# Patient Record
Sex: Female | Born: 1970 | Race: White | Hispanic: No | Marital: Married | Smoking: Former smoker
Health system: Southern US, Community
[De-identification: ages and names within clinical notes are randomized; demographics above are authoritative.]

## PROBLEM LIST (undated history)

## (undated) DIAGNOSIS — I10 Essential (primary) hypertension: Secondary | ICD-10-CM

## (undated) DIAGNOSIS — E782 Mixed hyperlipidemia: Secondary | ICD-10-CM

## (undated) DIAGNOSIS — F411 Generalized anxiety disorder: Secondary | ICD-10-CM

## (undated) DIAGNOSIS — J45909 Unspecified asthma, uncomplicated: Secondary | ICD-10-CM

## (undated) DIAGNOSIS — K219 Gastro-esophageal reflux disease without esophagitis: Secondary | ICD-10-CM

## (undated) HISTORY — DX: Mixed hyperlipidemia: E78.2

## (undated) HISTORY — DX: Gastro-esophageal reflux disease without esophagitis: K21.9

## (undated) HISTORY — DX: Generalized anxiety disorder: F41.1

---

## 2006-09-04 ENCOUNTER — Ambulatory Visit: Payer: Self-pay | Admitting: Unknown Physician Specialty

## 2006-09-07 ENCOUNTER — Ambulatory Visit: Payer: Self-pay | Admitting: Unknown Physician Specialty

## 2008-06-06 HISTORY — PX: ABDOMINAL HYSTERECTOMY: SHX81

## 2009-08-17 ENCOUNTER — Ambulatory Visit: Payer: Self-pay | Admitting: Internal Medicine

## 2009-08-18 ENCOUNTER — Ambulatory Visit: Payer: Self-pay | Admitting: Internal Medicine

## 2011-06-07 HISTORY — PX: BREAST EXCISIONAL BIOPSY: SUR124

## 2011-06-07 HISTORY — PX: BREAST BIOPSY: SHX20

## 2011-10-26 ENCOUNTER — Ambulatory Visit: Payer: Self-pay | Admitting: Internal Medicine

## 2011-10-28 ENCOUNTER — Ambulatory Visit: Payer: Self-pay | Admitting: Internal Medicine

## 2011-11-14 ENCOUNTER — Ambulatory Visit: Payer: Self-pay | Admitting: Emergency Medicine

## 2011-11-14 LAB — POTASSIUM: Potassium: 3.9 mmol/L (ref 3.5–5.1)

## 2011-11-15 ENCOUNTER — Ambulatory Visit: Payer: Self-pay | Admitting: Emergency Medicine

## 2011-11-16 LAB — PATHOLOGY REPORT

## 2013-04-09 ENCOUNTER — Ambulatory Visit: Payer: Self-pay | Admitting: Internal Medicine

## 2014-04-23 ENCOUNTER — Ambulatory Visit: Payer: Self-pay | Admitting: Internal Medicine

## 2014-09-28 NOTE — Op Note (Signed)
PATIENT NAME:  Kayla Carlson, Callee MR#:  [id number] DATE OF BIRTH:  [birth year]  DATE OF PROCEDURE:  11/15/2011  HISTORY: This is a patient who had a mammogram done which showed a possible lesion in her left breast, but the problem was only showing on one view. I did discuss with the radiologist who read the films and he told me that we may be able to localize that with ultrasound guidance and we were dealing with mammogram findings that were only showing it on one view.  I could not feel any lump in her breast. Needle localization was done by the radiologist. He told me that he had really difficult needle localization and he said the wire was going through the lesion.   DESCRIPTION OF PROCEDURE: So the patient was then brought to surgery. Under general anesthesia, the left breast was then prepped and draped. An incision was made over the top of the wire. After cutting skin and subcutaneous tissue, we did wide resection going all around the wire. I never saw the wire. I took out the whole wire with the breast tissue around it. After the bleeding was stopped with the Bovie, by inserting my finger, I examined the breast completely. I could not feel any lump inside anymore, which I really could not even feel preop. After this was done, the subcutaneous tissue was then closed with 5-0 and 4-0 Vicryl sutures and glue was then applied.   The patient also had developed an abscess in her right inner thigh and she was very sore to walk on it. She is a kind of obese woman. Her thighs rub each other so wonder if it is due to infection because of that. This is the first time I saw it. During surgery, after that, she had a very large, red swollen area over the medial aspect of the thigh. It was then incised and a lot of pus came out. It looked like some fat necrosis was present in the middle also. We irrigated this area and then the wound was left open with Iodoform gauze dressing on it. The patient tolerated the procedure  well. ____________________________ Alton RevereMasud S. Cecelia ByarsHashmi, MD msh:slb D: 11/15/2011 10:09:03 ET    T: 11/15/2011 12:00:28 ET       JOB#: [id number] cc: Sahra Converse S. Cecelia ByarsHashmi, MD, <Dictator> Margaretann LovelessNeelam S. Khan, MD Meryle ReadyMASUD S Elva Mauro MD ELECTRONICALLY SIGNED 11/16/2011 13:07

## 2015-06-02 ENCOUNTER — Other Ambulatory Visit: Payer: Self-pay | Admitting: Nurse Practitioner

## 2015-06-02 DIAGNOSIS — Z1231 Encounter for screening mammogram for malignant neoplasm of breast: Secondary | ICD-10-CM

## 2015-06-04 ENCOUNTER — Ambulatory Visit
Admission: RE | Admit: 2015-06-04 | Discharge: 2015-06-04 | Disposition: A | Discharge status: Home / Self Care | Payer: 59 | Source: Ambulatory Visit | Attending: Nurse Practitioner | Admitting: Nurse Practitioner

## 2015-06-04 DIAGNOSIS — Z1231 Encounter for screening mammogram for malignant neoplasm of breast: Secondary | ICD-10-CM | POA: Insufficient documentation

## 2015-06-09 ENCOUNTER — Ambulatory Visit: Payer: Self-pay

## 2016-05-09 ENCOUNTER — Other Ambulatory Visit: Payer: Self-pay | Admitting: Internal Medicine

## 2016-05-09 DIAGNOSIS — Z1231 Encounter for screening mammogram for malignant neoplasm of breast: Secondary | ICD-10-CM

## 2016-07-15 ENCOUNTER — Ambulatory Visit
Admission: RE | Admit: 2016-07-15 | Discharge: 2016-07-15 | Disposition: A | Discharge status: Home / Self Care | Payer: BLUE CROSS/BLUE SHIELD | Source: Ambulatory Visit | Attending: Internal Medicine | Admitting: Internal Medicine

## 2016-07-15 DIAGNOSIS — Z1231 Encounter for screening mammogram for malignant neoplasm of breast: Secondary | ICD-10-CM | POA: Insufficient documentation

## 2016-07-15 DIAGNOSIS — R928 Other abnormal and inconclusive findings on diagnostic imaging of breast: Secondary | ICD-10-CM | POA: Diagnosis not present

## 2016-07-19 ENCOUNTER — Other Ambulatory Visit: Payer: Self-pay | Admitting: Internal Medicine

## 2016-07-19 DIAGNOSIS — N6310 Unspecified lump in the right breast, unspecified quadrant: Secondary | ICD-10-CM

## 2016-07-19 DIAGNOSIS — R928 Other abnormal and inconclusive findings on diagnostic imaging of breast: Secondary | ICD-10-CM

## 2016-08-01 ENCOUNTER — Ambulatory Visit
Admission: RE | Admit: 2016-08-01 | Discharge: 2016-08-01 | Disposition: A | Discharge status: Home / Self Care | Payer: BLUE CROSS/BLUE SHIELD | Source: Ambulatory Visit | Attending: Internal Medicine | Admitting: Internal Medicine

## 2016-08-01 DIAGNOSIS — N6310 Unspecified lump in the right breast, unspecified quadrant: Secondary | ICD-10-CM

## 2016-08-01 DIAGNOSIS — R928 Other abnormal and inconclusive findings on diagnostic imaging of breast: Secondary | ICD-10-CM | POA: Diagnosis not present

## 2017-09-04 ENCOUNTER — Other Ambulatory Visit: Payer: Self-pay | Admitting: Internal Medicine

## 2017-09-04 DIAGNOSIS — Z1231 Encounter for screening mammogram for malignant neoplasm of breast: Secondary | ICD-10-CM

## 2017-09-09 ENCOUNTER — Other Ambulatory Visit: Payer: Self-pay

## 2017-09-09 ENCOUNTER — Encounter: Payer: Self-pay | Admitting: Emergency Medicine

## 2017-09-09 ENCOUNTER — Inpatient Hospital Stay
Admission: EM | Admit: 2017-09-09 | Discharge: 2017-09-11 | DRG: 191 | Disposition: A | Discharge status: Home / Self Care | Payer: BLUE CROSS/BLUE SHIELD | Attending: Internal Medicine | Admitting: Internal Medicine

## 2017-09-09 ENCOUNTER — Emergency Department: Payer: BLUE CROSS/BLUE SHIELD

## 2017-09-09 DIAGNOSIS — I10 Essential (primary) hypertension: Secondary | ICD-10-CM | POA: Diagnosis present

## 2017-09-09 DIAGNOSIS — Z6841 Body Mass Index (BMI) 40.0 and over, adult: Secondary | ICD-10-CM

## 2017-09-09 DIAGNOSIS — J441 Chronic obstructive pulmonary disease with (acute) exacerbation: Principal | ICD-10-CM | POA: Diagnosis present

## 2017-09-09 DIAGNOSIS — F17200 Nicotine dependence, unspecified, uncomplicated: Secondary | ICD-10-CM | POA: Diagnosis present

## 2017-09-09 DIAGNOSIS — R0602 Shortness of breath: Secondary | ICD-10-CM | POA: Diagnosis not present

## 2017-09-09 DIAGNOSIS — J440 Chronic obstructive pulmonary disease with acute lower respiratory infection: Secondary | ICD-10-CM | POA: Diagnosis present

## 2017-09-09 DIAGNOSIS — Z803 Family history of malignant neoplasm of breast: Secondary | ICD-10-CM

## 2017-09-09 DIAGNOSIS — Z9981 Dependence on supplemental oxygen: Secondary | ICD-10-CM

## 2017-09-09 DIAGNOSIS — J40 Bronchitis, not specified as acute or chronic: Secondary | ICD-10-CM

## 2017-09-09 DIAGNOSIS — R0902 Hypoxemia: Secondary | ICD-10-CM | POA: Diagnosis present

## 2017-09-09 DIAGNOSIS — E785 Hyperlipidemia, unspecified: Secondary | ICD-10-CM | POA: Diagnosis present

## 2017-09-09 DIAGNOSIS — J209 Acute bronchitis, unspecified: Secondary | ICD-10-CM | POA: Diagnosis present

## 2017-09-09 DIAGNOSIS — Z720 Tobacco use: Secondary | ICD-10-CM

## 2017-09-09 DIAGNOSIS — Z90710 Acquired absence of both cervix and uterus: Secondary | ICD-10-CM

## 2017-09-09 DIAGNOSIS — Z91048 Other nonmedicinal substance allergy status: Secondary | ICD-10-CM

## 2017-09-09 DIAGNOSIS — Z885 Allergy status to narcotic agent status: Secondary | ICD-10-CM

## 2017-09-09 DIAGNOSIS — F419 Anxiety disorder, unspecified: Secondary | ICD-10-CM | POA: Diagnosis present

## 2017-09-09 DIAGNOSIS — R0603 Acute respiratory distress: Secondary | ICD-10-CM | POA: Diagnosis present

## 2017-09-09 DIAGNOSIS — J45901 Unspecified asthma with (acute) exacerbation: Secondary | ICD-10-CM

## 2017-09-09 HISTORY — DX: Essential (primary) hypertension: I10

## 2017-09-09 HISTORY — DX: Unspecified asthma, uncomplicated: J45.909

## 2017-09-09 LAB — CBC
HCT: 48.3 % — ABNORMAL HIGH (ref 35.0–47.0)
Hemoglobin: 16.3 g/dL — ABNORMAL HIGH (ref 12.0–16.0)
MCH: 31 pg (ref 26.0–34.0)
MCHC: 33.7 g/dL (ref 32.0–36.0)
MCV: 92.2 fL (ref 80.0–100.0)
Platelets: 235 K/uL (ref 150–440)
RBC: 5.25 MIL/uL — ABNORMAL HIGH (ref 3.80–5.20)
RDW: 13.8 % (ref 11.5–14.5)
WBC: 16.1 K/uL — ABNORMAL HIGH (ref 3.6–11.0)

## 2017-09-09 LAB — BASIC METABOLIC PANEL
Anion gap: 12 (ref 5–15)
BUN: 6 mg/dL (ref 6–20)
CALCIUM: 10.7 mg/dL — AB (ref 8.9–10.3)
CO2: 23 mmol/L (ref 22–32)
Chloride: 103 mmol/L (ref 101–111)
Creatinine, Ser: 0.7 mg/dL (ref 0.44–1.00)
GFR calc Af Amer: >60 mL/min (ref >60)
GLUCOSE: 157 mg/dL — AB (ref 65–99)
POTASSIUM: 3.4 mmol/L — AB (ref 3.5–5.1)
Sodium: 138 mmol/L (ref 135–145)

## 2017-09-09 LAB — TROPONIN I: Troponin I: <0.03 ng/mL

## 2017-09-09 MED ORDER — ALBUTEROL SULFATE HFA 108 (90 BASE) MCG/ACT IN AERS: 2.0000 | INHALATION_SPRAY | Freq: Four times a day (QID) | RESPIRATORY_TRACT | 2 refills | Status: DC | PRN

## 2017-09-09 MED ORDER — METHYLPREDNISOLONE SODIUM SUCC 125 MG IJ SOLR
125.0000 mg | Freq: Once | INTRAMUSCULAR | Status: AC
  Administered 2017-09-09: 125 mg via INTRAVENOUS
  Filled 2017-09-09: qty 2

## 2017-09-09 MED ORDER — ALBUTEROL SULFATE (2.5 MG/3ML) 0.083% IN NEBU
5.0000 mg | INHALATION_SOLUTION | Freq: Once | RESPIRATORY_TRACT | Status: AC
  Administered 2017-09-09: 5 mg via RESPIRATORY_TRACT
  Filled 2017-09-09: qty 6

## 2017-09-09 MED ORDER — PREDNISONE 20 MG PO TABS: 60.0000 mg | ORAL_TABLET | Freq: Every day | ORAL | 0 refills | Status: DC

## 2017-09-09 MED ORDER — IPRATROPIUM-ALBUTEROL 0.5-2.5 (3) MG/3ML IN SOLN
3.0000 mL | Freq: Once | RESPIRATORY_TRACT | Status: AC
  Administered 2017-09-09: 3 mL via RESPIRATORY_TRACT
  Filled 2017-09-09: qty 3

## 2017-09-09 MED ORDER — IPRATROPIUM-ALBUTEROL 0.5-2.5 (3) MG/3ML IN SOLN
3.0000 mL | Freq: Once | RESPIRATORY_TRACT | Status: AC
  Administered 2017-09-09: 3 mL via RESPIRATORY_TRACT

## 2017-09-09 MED ORDER — IPRATROPIUM-ALBUTEROL 0.5-2.5 (3) MG/3ML IN SOLN
RESPIRATORY_TRACT | Status: AC
  Administered 2017-09-09: 3 mL via RESPIRATORY_TRACT
  Filled 2017-09-09: qty 3

## 2017-09-09 MED ORDER — AZITHROMYCIN 500 MG PO TABS
500.0000 mg | ORAL_TABLET | Freq: Every day | ORAL | Status: DC
  Administered 2017-09-09 – 2017-09-11 (×3): 500 mg via ORAL
  Filled 2017-09-09 (×3): qty 1

## 2017-09-09 MED ORDER — SODIUM CHLORIDE 0.9 % IV BOLUS
500.0000 mL | Freq: Once | INTRAVENOUS | Status: AC
  Administered 2017-09-09: 500 mL via INTRAVENOUS

## 2017-09-09 MED ORDER — AZITHROMYCIN 250 MG PO TABS: ORAL_TABLET | ORAL | 0 refills | Status: DC

## 2017-09-09 NOTE — ED Provider Notes (Signed)
[facility] Emergency Department Provider Note  ____________________________________________   I have reviewed the triage vital signs and the nursing notes. Where available I have reviewed prior notes and, if possible and indicated, outside hospital notes.    HISTORY  Chief Complaint Shortness of Breath    HPI Kayla SellsSusan Carlson is a 47 y.o. female with a history of significant tobacco abuse, pack a day smoker for 18 years at least, does have a history of asthma in the past, and has an inhaler, she has URI symptoms with mild sore throat slight rhinorrhea and cough for the last couple days and became very wheezy today.  No fever.  Chest pain no leg swelling.  She is on home oxygen.  She received a triage albuterol nebulizer and feels much better although she is still wheezing.  She is adamant that she does not wish to be admitted to the hospital.  Symptoms began in earnest today although she is been having mild cold symptoms for a few days, her albuterol made it better nothing else seems to make it worse no other associated symptoms     Past Medical History:  Diagnosis Date  . Asthma   . Hypertension     There are no active problems to display for this patient.   Past Surgical History:  Procedure Laterality Date  . ABDOMINAL HYSTERECTOMY  2010  . BREAST BIOPSY Left 2013    Prior to Admission medications   Medication Sig Start Date End Date Taking? Authorizing Provider  amLODipine (NORVASC) 5 MG tablet Take 1 tablet by mouth daily. 06/03/17  Yes [provider]  benazepril (LOTENSIN) 10 MG tablet Take 1 tablet by mouth daily. 06/03/17  Yes [provider]  spironolactone (ALDACTONE) 50 MG tablet Take 1 tablet by mouth daily. 08/01/17  Yes [provider]  Vitamin D, Ergocalciferol, (DRISDOL) 50000 units CAPS capsule Take 1 capsule by mouth every 7 (seven) days. 09/03/17  Yes [provider]  albuterol (PROVENTIL  HFA;VENTOLIN HFA) 108 (90 Base) MCG/ACT inhaler Inhale 2 puffs into the lungs every 6 (six) hours as needed. 09/02/17   [provider]    Allergies Codeine and Lisinopril  Family History  Problem Relation Age of Onset  . Breast cancer Mother 2945    Social History Social History   Tobacco Use  . Smoking status: Current Every Day Smoker  . Smokeless tobacco: Never Used  Substance Use Topics  . Alcohol use: Never    Frequency: Never  . Drug use: Not on file    Review of Systems Constitutional: No fever/chills Eyes: No visual changes. ENT: + sore throat. No stiff neck no neck pain Cardiovascular: Denies chest pain. Respiratory: + shortness of breath. Gastrointestinal:   no vomiting.  No diarrhea.  No constipation. Genitourinary: Negative for dysuria. Musculoskeletal: Negative lower extremity swelling Skin: Negative for rash. Neurological: Negative for severe headaches, focal weakness or numbness.   ____________________________________________   PHYSICAL EXAM:  VITAL SIGNS: ED Triage Vitals  Enc Vitals Group     BP 09/09/17 1828 (!) 150/101     Pulse Rate 09/09/17 1828 (!) 134     Resp 09/09/17 1828 (!) 30     Temp 09/09/17 1828 98.9 F (37.2 C)     Temp Source 09/09/17 1828 Oral     SpO2 09/09/17 1828 (!) 88 %     Weight 09/09/17 1829 235 lb (106.6 kg)     Height 09/09/17 1829 5\' 2"  (1.575 m)  Head Circumference --      Peak Flow --      Pain Score 09/09/17 1829 0     Pain Loc --      Pain Edu? --      Excl. in GC? --     Constitutional: Alert and oriented. Well appearing and in no acute distress. Eyes: Conjunctivae are normal Head: Atraumatic HEENT: No congestion/rhinnorhea. Mucous membranes are moist.  Oropharynx non-erythematous Neck:   Nontender with no meningismus, no masses, no stridor Cardiovascular: Tachycardia noted regular rhythm. Grossly normal heart sounds.  Good peripheral circulation. Respiratory: Normal respiratory effort.   Patient with very slight diffuse wheeze noted Abdominal: Soft and nontender. No distention. No guarding no rebound Back:  There is no focal tenderness or step off.  there is no midline tenderness there are no lesions noted. there is no CVA tenderness Musculoskeletal: No lower extremity tenderness, no upper extremity tenderness. No joint effusions, no DVT signs strong distal pulses no edema Neurologic:  Normal speech and language. No gross focal neurologic deficits are appreciated.  Skin:  Skin is warm, dry and intact. No rash noted. Psychiatric: Mood and affect are normal. Speech and behavior are normal.  ____________________________________________   LABS (all labs ordered are listed, but only abnormal results are displayed)  Labs Reviewed  BASIC METABOLIC PANEL - Abnormal; Notable for the following components:      Result Value   Potassium 3.4 (*)    Glucose, Bld 157 (*)    Calcium 10.7 (*)    All other components within normal limits  CBC - Abnormal; Notable for the following components:   WBC 16.1 (*)    RBC 5.25 (*)    Hemoglobin 16.3 (*)    HCT 48.3 (*)    All other components within normal limits  TROPONIN I    Pertinent labs  results that were available during my care of the patient were reviewed by me and considered in my medical decision making (see chart for details). ____________________________________________  EKG  I personally interpreted any EKGs ordered by me or triage Sinus tach rate 131 no acute ST elevation or depression, LAD noted no acute ischemia ____________________________________________  RADIOLOGY  Pertinent labs & imaging results that were available during my care of the patient were reviewed by me and considered in my medical decision making (see chart for details). If possible, patient and/or family made aware of any abnormal findings.  Dg Chest 2 View  Result Date: 09/09/2017 CLINICAL DATA:  Cough and shortness of Breath EXAM: CHEST - 2 VIEW  COMPARISON:  None. FINDINGS: Cardiac shadow is within normal limits. Some diffuse interstitial changes are noted without focal confluent infiltrate suggestive of bronchitis. Mild left basilar atelectasis is noted as well. No effusion is seen. No bony abnormality is noted. IMPRESSION: Changes of bronchitis with left basilar atelectasis. Electronically Signed   By: Alcide Clever M.D.   On: 09/09/2017 19:48   ____________________________________________    PROCEDURES  Procedure(s) performed: None  Procedures  Critical Care performed: None  ____________________________________________   INITIAL IMPRESSION / ASSESSMENT AND PLAN / ED COURSE  Pertinent labs & imaging results that were available during my care of the patient were reviewed by me and considered in my medical decision making (see chart for details).  Patient here with cough and wheeze.  Chest x-ray shows bronchitic changes we will treat with antibiotics.  I have extensively counseled smoking cessation for more than 6 minutes.  No evidence of ACS PE or  dissection.  However, patient is again somewhat wheezy, her sats did improve markedly after just one nebulizer I will give her another.  We have discussed admission of the hospital but this time she very much would prefer to go home and she declines admission.  She understands risk benefits and alternatives of this course of action.  We will send her home therefore if she clears on steroids, continued albuterol, and antibiotics. Signed out to dr. Derrill Kay at the end of my shift.    ____________________________________________   FINAL CLINICAL IMPRESSION(S) / ED DIAGNOSES  Final diagnoses:  None      This chart was dictated using voice recognition software.  Despite best efforts to proofread,  errors can occur which can change meaning.      Jeanmarie Plant, MD 09/09/17 2109

## 2017-09-09 NOTE — ED Triage Notes (Signed)
Pt ambulatory to triage w/ labored breathing, speaking in short sentences, reports cold sx since yesterday, today became SOB, using inhaler w/o relief.  Pt hx of seasonal asthma.  Lung sounds diminished w/ exp wheeze.  Pt o2sat 88% on RA.

## 2017-09-09 NOTE — ED Notes (Signed)
Attempted to get pt up to ambulate down hall to observe O2 sat. Pt O2 immdediately declined to 86% RA before reaching the hall way. Pt placed back on 2L via nasal canula.

## 2017-09-09 NOTE — Discharge Instructions (Signed)
Return to the emergency room for any new or worrisome symptoms including shortness of breath, stop smoking.  Take the antibiotics until they are gone take the steroids until they are gone.  Stop smoking.

## 2017-09-10 ENCOUNTER — Other Ambulatory Visit: Payer: Self-pay

## 2017-09-10 DIAGNOSIS — F17200 Nicotine dependence, unspecified, uncomplicated: Secondary | ICD-10-CM | POA: Diagnosis present

## 2017-09-10 DIAGNOSIS — Z803 Family history of malignant neoplasm of breast: Secondary | ICD-10-CM | POA: Diagnosis not present

## 2017-09-10 DIAGNOSIS — R0602 Shortness of breath: Secondary | ICD-10-CM | POA: Diagnosis present

## 2017-09-10 DIAGNOSIS — J209 Acute bronchitis, unspecified: Secondary | ICD-10-CM | POA: Diagnosis present

## 2017-09-10 DIAGNOSIS — Z6841 Body Mass Index (BMI) 40.0 and over, adult: Secondary | ICD-10-CM | POA: Diagnosis not present

## 2017-09-10 DIAGNOSIS — Z9981 Dependence on supplemental oxygen: Secondary | ICD-10-CM | POA: Diagnosis not present

## 2017-09-10 DIAGNOSIS — Z885 Allergy status to narcotic agent status: Secondary | ICD-10-CM | POA: Diagnosis not present

## 2017-09-10 DIAGNOSIS — R0603 Acute respiratory distress: Secondary | ICD-10-CM | POA: Diagnosis present

## 2017-09-10 DIAGNOSIS — Z90710 Acquired absence of both cervix and uterus: Secondary | ICD-10-CM | POA: Diagnosis not present

## 2017-09-10 DIAGNOSIS — R0902 Hypoxemia: Secondary | ICD-10-CM | POA: Diagnosis present

## 2017-09-10 DIAGNOSIS — E785 Hyperlipidemia, unspecified: Secondary | ICD-10-CM | POA: Diagnosis present

## 2017-09-10 DIAGNOSIS — J440 Chronic obstructive pulmonary disease with acute lower respiratory infection: Secondary | ICD-10-CM | POA: Diagnosis present

## 2017-09-10 DIAGNOSIS — I10 Essential (primary) hypertension: Secondary | ICD-10-CM | POA: Diagnosis present

## 2017-09-10 DIAGNOSIS — F419 Anxiety disorder, unspecified: Secondary | ICD-10-CM | POA: Diagnosis present

## 2017-09-10 DIAGNOSIS — J441 Chronic obstructive pulmonary disease with (acute) exacerbation: Secondary | ICD-10-CM | POA: Diagnosis present

## 2017-09-10 DIAGNOSIS — Z91048 Other nonmedicinal substance allergy status: Secondary | ICD-10-CM | POA: Diagnosis not present

## 2017-09-10 LAB — BASIC METABOLIC PANEL
ANION GAP: 8 (ref 5–15)
BUN: 9 mg/dL (ref 6–20)
CALCIUM: 10.5 mg/dL — AB (ref 8.9–10.3)
CO2: 24 mmol/L (ref 22–32)
CREATININE: 0.74 mg/dL (ref 0.44–1.00)
Chloride: 106 mmol/L (ref 101–111)
GFR calc Af Amer: >60 mL/min (ref >60)
Glucose, Bld: 167 mg/dL — ABNORMAL HIGH (ref 65–99)
Potassium: 3.8 mmol/L (ref 3.5–5.1)
Sodium: 138 mmol/L (ref 135–145)

## 2017-09-10 LAB — CBC
HCT: 44 % (ref 35.0–47.0)
HEMOGLOBIN: 15.5 g/dL (ref 12.0–16.0)
MCH: 32 pg (ref 26.0–34.0)
MCHC: 35.1 g/dL (ref 32.0–36.0)
MCV: 91.1 fL (ref 80.0–100.0)
PLATELETS: 229 10*3/uL (ref 150–440)
RBC: 4.83 MIL/uL (ref 3.80–5.20)
RDW: 13.6 % (ref 11.5–14.5)
WBC: 15.2 10*3/uL — ABNORMAL HIGH (ref 3.6–11.0)

## 2017-09-10 MED ORDER — GUAIFENESIN-DM 100-10 MG/5ML PO SYRP: 5.0000 mL | ORAL_SOLUTION | ORAL | Status: DC | PRN

## 2017-09-10 MED ORDER — IPRATROPIUM-ALBUTEROL 0.5-2.5 (3) MG/3ML IN SOLN
3.0000 mL | RESPIRATORY_TRACT | Status: DC
Start: 2017-09-10 — End: 2017-09-11
  Administered 2017-09-10 – 2017-09-11 (×6): 3 mL via RESPIRATORY_TRACT
  Filled 2017-09-10 (×5): qty 3
  Filled 2017-09-10: qty 39

## 2017-09-10 MED ORDER — SPIRONOLACTONE 25 MG PO TABS
50.0000 mg | ORAL_TABLET | Freq: Every day | ORAL | Status: DC
  Administered 2017-09-10 – 2017-09-11 (×2): 50 mg via ORAL
  Filled 2017-09-10 (×2): qty 2

## 2017-09-10 MED ORDER — METHYLPREDNISOLONE SODIUM SUCC 125 MG IJ SOLR
60.0000 mg | Freq: Four times a day (QID) | INTRAMUSCULAR | Status: DC
  Administered 2017-09-10 – 2017-09-11 (×6): 60 mg via INTRAVENOUS
  Filled 2017-09-10 (×6): qty 2

## 2017-09-10 MED ORDER — ONDANSETRON HCL 4 MG/2ML IJ SOLN: 4.0000 mg | Freq: Four times a day (QID) | INTRAMUSCULAR | Status: DC | PRN

## 2017-09-10 MED ORDER — ACETAMINOPHEN 325 MG PO TABS
650.0000 mg | ORAL_TABLET | Freq: Four times a day (QID) | ORAL | Status: DC | PRN
Start: 2017-09-10 — End: 2017-09-11

## 2017-09-10 MED ORDER — NICOTINE 21 MG/24HR TD PT24
21.0000 mg | MEDICATED_PATCH | Freq: Every day | TRANSDERMAL | Status: DC
  Administered 2017-09-10: 21 mg via TRANSDERMAL
  Filled 2017-09-10 (×2): qty 1

## 2017-09-10 MED ORDER — ONDANSETRON HCL 4 MG PO TABS: 4.0000 mg | ORAL_TABLET | Freq: Four times a day (QID) | ORAL | Status: DC | PRN

## 2017-09-10 MED ORDER — ACETAMINOPHEN 650 MG RE SUPP
650.0000 mg | Freq: Four times a day (QID) | RECTAL | Status: DC | PRN
Start: 2017-09-10 — End: 2017-09-11

## 2017-09-10 MED ORDER — BENAZEPRIL HCL 10 MG PO TABS
10.0000 mg | ORAL_TABLET | Freq: Every day | ORAL | Status: DC
  Administered 2017-09-10 – 2017-09-11 (×2): 10 mg via ORAL
  Filled 2017-09-10 (×2): qty 1

## 2017-09-10 MED ORDER — IPRATROPIUM-ALBUTEROL 0.5-2.5 (3) MG/3ML IN SOLN
3.0000 mL | RESPIRATORY_TRACT | Status: DC | PRN
  Administered 2017-09-10 (×2): 3 mL via RESPIRATORY_TRACT
  Filled 2017-09-10 (×2): qty 3

## 2017-09-10 MED ORDER — ENOXAPARIN SODIUM 40 MG/0.4ML ~~LOC~~ SOLN
40.0000 mg | Freq: Two times a day (BID) | SUBCUTANEOUS | Status: DC
  Administered 2017-09-10: 40 mg via SUBCUTANEOUS
  Filled 2017-09-10 (×3): qty 0.4

## 2017-09-10 MED ORDER — AMLODIPINE BESYLATE 5 MG PO TABS
5.0000 mg | ORAL_TABLET | Freq: Every day | ORAL | Status: DC
  Administered 2017-09-10 – 2017-09-11 (×2): 5 mg via ORAL
  Filled 2017-09-10 (×2): qty 1

## 2017-09-10 NOTE — H&P (Signed)
[facility] [place] [facility]   PATIENT NAME: Kayla SellsSusan Keithley    MR#:  [id number]  DATE OF BIRTH:  [birth year]  DATE OF ADMISSION:  09/09/2017  PRIMARY CARE PHYSICIAN: Margaretann LovelessKhan, Neelam S, MD   REQUESTING/REFERRING PHYSICIAN: Derrill KayGoodman, MD  CHIEF COMPLAINT:   Chief Complaint  Patient presents with  . Shortness of Breath    HISTORY OF PRESENT ILLNESS:  Kayla Carlson  is a 47 y.o. female who presents with significant shortness of breath and wheezing.  Patient states she has had a "cold" for the past couple of days.  She has been taking over-the-counter Mucinex and Robitussin.  She states that today she became acutely short of breath and had significant wheezing.  She has a prior diagnosis of asthma, but is also a current everyday smoker and has been for some time.  She states she has an albuterol inhaler at home but almost never has to use it.  Here in the ED workup is consistent with asthma/COPD exacerbation.  Hospitalist were called for admission  PAST MEDICAL HISTORY:   Past Medical History:  Diagnosis Date  . Asthma   . Hypertension      PAST SURGICAL HISTORY:   Past Surgical History:  Procedure Laterality Date  . ABDOMINAL HYSTERECTOMY  2010  . BREAST BIOPSY Left 2013     SOCIAL HISTORY:   Social History   Tobacco Use  . Smoking status: Current Every Day Smoker  . Smokeless tobacco: Never Used  Substance Use Topics  . Alcohol use: Never    Frequency: Never     FAMILY HISTORY:   Family History  Problem Relation Age of Onset  . Breast cancer Mother 2045     DRUG ALLERGIES:   Allergies  Allergen Reactions  . Codeine   . Lisinopril     MEDICATIONS AT HOME:   Prior to Admission medications   Medication Sig Start Date End Date Taking? Authorizing Provider  amLODipine (NORVASC) 5 MG tablet Take 1 tablet by mouth daily. 06/03/17  Yes [provider]  benazepril (LOTENSIN) 10 MG tablet Take 1 tablet by mouth daily.  06/03/17  Yes [provider]  spironolactone (ALDACTONE) 50 MG tablet Take 1 tablet by mouth daily. 08/01/17  Yes [provider]  Vitamin D, Ergocalciferol, (DRISDOL) 50000 units CAPS capsule Take 1 capsule by mouth every 7 (seven) days. 09/03/17  Yes [provider]  albuterol (PROVENTIL HFA;VENTOLIN HFA) 108 (90 Base) MCG/ACT inhaler Inhale 2 puffs into the lungs every 6 (six) hours as needed. 09/02/17   [provider]  albuterol (PROVENTIL HFA;VENTOLIN HFA) 108 (90 Base) MCG/ACT inhaler Inhale 2 puffs into the lungs every 6 (six) hours as needed for wheezing or shortness of breath. 09/09/17   Jeanmarie PlantMcShane, James A, MD  azithromycin (ZITHROMAX Z-PAK) 250 MG tablet Take 2 tablets (500 mg) on  Day 1,  followed by 1 tablet (250 mg) once daily on Days 2 through 5. 09/09/17   Jeanmarie PlantMcShane, James A, MD  predniSONE (DELTASONE) 20 MG tablet Take 3 tablets (60 mg total) by mouth daily. 09/09/17   Jeanmarie PlantMcShane, James A, MD    REVIEW OF SYSTEMS:  Review of Systems  Constitutional: Negative for chills, fever, malaise/fatigue and weight loss.  HENT: Negative for ear pain, hearing loss and tinnitus.   Eyes: Negative for blurred vision, double vision, pain and redness.  Respiratory: Positive for cough, shortness of breath and wheezing. Negative for hemoptysis.   Cardiovascular: Negative for chest pain, palpitations,  orthopnea and leg swelling.  Gastrointestinal: Negative for abdominal pain, constipation, diarrhea, nausea and vomiting.  Genitourinary: Negative for dysuria, frequency and hematuria.  Musculoskeletal: Negative for back pain, joint pain and neck pain.  Skin:       No acne, rash, or lesions  Neurological: Negative for dizziness, tremors, focal weakness and weakness.  Endo/Heme/Allergies: Negative for polydipsia. Does not bruise/bleed easily.  Psychiatric/Behavioral: Negative for depression. The patient is not nervous/anxious and does not have insomnia.      VITAL SIGNS:    Vitals:   09/09/17 2200 09/09/17 2230 09/09/17 2314 09/09/17 2330  BP: (!) 152/76 136/71  (!) 139/57  Pulse: (!) 113 (!) 109  (!) 113  Resp: (!) 26 16  20   Temp:      TempSrc:      SpO2: 93% (!) 89% 93%   Weight:      Height:       Wt Readings from Last 3 Encounters:  09/09/17 106.6 kg (235 lb)    PHYSICAL EXAMINATION:  Physical Exam  Vitals reviewed. Constitutional: She is oriented to person, place, and time. She appears well-developed and well-nourished. No distress.  HENT:  Head: Normocephalic and atraumatic.  Mouth/Throat: Oropharynx is clear and moist.  Eyes: Pupils are equal, round, and reactive to light. Conjunctivae and EOM are normal. No scleral icterus.  Neck: Normal range of motion. Neck supple. No JVD present. No thyromegaly present.  Cardiovascular: Regular rhythm and intact distal pulses. Exam reveals no gallop and no friction rub.  No murmur heard. Tachycardic  Respiratory: Effort normal. No respiratory distress. She has wheezes (Expiratory wheezes). She has no rales.  GI: Soft. Bowel sounds are normal. She exhibits no distension. There is no tenderness.  Musculoskeletal: Normal range of motion. She exhibits no edema.  No arthritis, no gout  Lymphadenopathy:    She has no cervical adenopathy.  Neurological: She is alert and oriented to person, place, and time. No cranial nerve deficit.  No dysarthria, no aphasia  Skin: Skin is warm and dry. No rash noted. No erythema.  Psychiatric: She has a normal mood and affect. Her behavior is normal. Judgment and thought content normal.    LABORATORY PANEL:   CBC Recent Labs  Lab 09/09/17 1836  WBC 16.1*  HGB 16.3*  HCT 48.3*  PLT 235   ------------------------------------------------------------------------------------------------------------------  Chemistries  Recent Labs  Lab 09/09/17 1836  NA 138  K 3.4*  CL 103  CO2 23  GLUCOSE 157*  BUN 6  CREATININE 0.70  CALCIUM 10.7*    ------------------------------------------------------------------------------------------------------------------  Cardiac Enzymes Recent Labs  Lab 09/09/17 1836  TROPONINI <0.03   ------------------------------------------------------------------------------------------------------------------  RADIOLOGY:  Dg Chest 2 View  Result Date: 09/09/2017 CLINICAL DATA:  Cough and shortness of Breath EXAM: CHEST - 2 VIEW COMPARISON:  None. FINDINGS: Cardiac shadow is within normal limits. Some diffuse interstitial changes are noted without focal confluent infiltrate suggestive of bronchitis. Mild left basilar atelectasis is noted as well. No effusion is seen. No bony abnormality is noted. IMPRESSION: Changes of bronchitis with left basilar atelectasis. Electronically Signed   By: Alcide Clever M.D.   On: 09/09/2017 19:48    EKG:   Orders placed or performed during the hospital encounter of 09/09/17  . EKG 12-Lead  . EKG 12-Lead  . ED EKG  . ED EKG    IMPRESSION AND PLAN:  Principal Problem:   COPD exacerbation (HCC) -IV Solu-Medrol, duo nebs, azithromycin, PRN antitussive Active Problems:   HTN (hypertension) -continue home  meds   HLD (hyperlipidemia) -home dose antilipid   Anxiety -home dose anxiolytics  Chart review performed and case discussed with ED provider. Labs, imaging and/or ECG reviewed by provider and discussed with patient/family. Management plans discussed with the patient and/or family.  DVT PROPHYLAXIS: SubQ lovenox  GI PROPHYLAXIS: None  ADMISSION STATUS: Observation  CODE STATUS:   TOTAL TIME TAKING CARE OF THIS PATIENT: 40 minutes.   Anne Hahn, Karel Turpen FIELDING 09/10/2017, 12:52 AM  [facility]  Office  [phone]  CC: Primary care physician; Margaretann Loveless, MD  Note:  This document was prepared using Dragon voice recognition software and may include unintentional dictation errors.

## 2017-09-10 NOTE — Progress Notes (Addendum)
[employer] - [place] at [facility]                                                                                                                                                                                  Patient Demographics   Kayla Carlson, is a 47 y.o. female, DOB - [birth year], WRU:[id number]  Admit date - 09/09/2017   Admitting Physician Oralia Manis, MD  Outpatient Primary MD for the patient is Margaretann Loveless, MD   LOS - 0  Subjective: Patient seen and evaluated today Has difficulty breathing Has cough productive of phlegm Oxygen saturation drops when patient was being weaned off oxygen Currently comfortable on oxygen via nasal cannula 2 L  Review of Systems:   CONSTITUTIONAL: No documented fever. No fatigue, weakness. No weight gain, no weight loss.  EYES: No blurry or double vision.  ENT: No tinnitus. No postnasal drip. No redness of the oropharynx.  RESPIRATORY: Has cough, bilateral wheezing, no hemoptysis.  Has dyspnea.  CARDIOVASCULAR: No chest pain. No orthopnea. No palpitations. No syncope.  GASTROINTESTINAL: No nausea, no vomiting or diarrhea. No abdominal pain. No melena or hematochezia.  GENITOURINARY: No dysuria or hematuria.  ENDOCRINE: No polyuria or nocturia. No heat or cold intolerance.  HEMATOLOGY: No anemia. No bruising. No bleeding.  INTEGUMENTARY: No rashes. No lesions.  MUSCULOSKELETAL: No arthritis. No swelling. No gout.  NEUROLOGIC: No numbness, tingling, or ataxia. No seizure-type activity.  PSYCHIATRIC: No anxiety. No insomnia. No ADD.    Vitals:   Vitals:   09/10/17 0134 09/10/17 0200 09/10/17 0611 09/10/17 0819  BP: (!) 126/58 134/64  134/65  Pulse: (!) 105 100  97  Resp: (!) 24   18  Temp:  97.6 F (36.4 C)  98.5 F (36.9 C)  TempSrc:  Oral  Oral  SpO2: 92% 93% 92% 90%  Weight:      Height:        Wt Readings from Last 3 Encounters:  09/09/17 106.6 kg (235 lb)     Intake/Output Summary (Last 24 hours) at  09/10/2017 1250 Last data filed at 09/10/2017 0300 Gross per 24 hour  Intake 740 ml  Output -  Net 740 ml    Physical Exam:   GENERAL: Pleasant-appearing in no apparent distress.  HEAD, EYES, EARS, NOSE AND THROAT: Atraumatic, normocephalic. Extraocular muscles are intact. Pupils equal and reactive to light. Sclerae anicteric. No conjunctival injection. No oro-pharyngeal erythema.  NECK: Supple. There is no jugular venous distention. No bruits, no lymphadenopathy, no thyromegaly.  HEART: Regular rate and rhythm,. No murmurs, no rubs, no clicks.  LUNGS: Decreased airflow in both lungs Bilateral wheezing, scattered rales in both lungs ABDOMEN:  Soft, flat, nontender, nondistended. Has good bowel sounds. No hepatosplenomegaly appreciated.  EXTREMITIES: No evidence of any cyanosis, clubbing, or peripheral edema.  +2 pedal and radial pulses bilaterally.  NEUROLOGIC: The patient is alert, awake, and oriented x3 with no focal motor or sensory deficits appreciated bilaterally.  SKIN: Moist and warm with no rashes appreciated.  Psych: Not anxious, depressed LN: No inguinal LN enlargement    Antibiotics   Anti-infectives (From admission, onward)   Start     Dose/Rate Route Frequency Ordered Stop   09/09/17 2100  azithromycin (ZITHROMAX) tablet 500 mg     500 mg Oral Daily 09/09/17 2053     09/09/17 0000  azithromycin (ZITHROMAX Z-PAK) 250 MG tablet        09/09/17 2110        Medications   Scheduled Meds: . amLODipine  5 mg Oral Daily  . azithromycin  500 mg Oral Daily  . benazepril  10 mg Oral Daily  . enoxaparin (LOVENOX) injection  40 mg Subcutaneous BID  . ipratropium-albuterol  3 mL Nebulization Q4H  . methylPREDNISolone (SOLU-MEDROL) injection  60 mg Intravenous Q6H  . spironolactone  50 mg Oral Daily   Continuous Infusions: PRN Meds:.acetaminophen **OR** acetaminophen, guaiFENesin-dextromethorphan, ondansetron **OR** ondansetron (ZOFRAN) IV   Data Review:   Micro  Results No results found for this or any previous visit (from the past 240 hour(s)).  Radiology Reports Dg Chest 2 View  Result Date: 09/09/2017 CLINICAL DATA:  Cough and shortness of Breath EXAM: CHEST - 2 VIEW COMPARISON:  None. FINDINGS: Cardiac shadow is within normal limits. Some diffuse interstitial changes are noted without focal confluent infiltrate suggestive of bronchitis. Mild left basilar atelectasis is noted as well. No effusion is seen. No bony abnormality is noted. IMPRESSION: Changes of bronchitis with left basilar atelectasis. Electronically Signed   By: Alcide CleverMark  Lukens M.D.   On: 09/09/2017 19:48     CBC Recent Labs  Lab 09/09/17 1836 09/10/17 0356  WBC 16.1* 15.2*  HGB 16.3* 15.5  HCT 48.3* 44.0  PLT 235 229  MCV 92.2 91.1  MCH 31.0 32.0  MCHC 33.7 35.1  RDW 13.8 13.6    Chemistries  Recent Labs  Lab 09/09/17 1836 09/10/17 0356  NA 138 138  K 3.4* 3.8  CL 103 106  CO2 23 24  GLUCOSE 157* 167*  BUN 6 9  CREATININE 0.70 0.74  CALCIUM 10.7* 10.5*   ------------------------------------------------------------------------------------------------------------------ estimated creatinine clearance is 99.8 mL/min (by C-G formula based on SCr of 0.74 mg/dL). ------------------------------------------------------------------------------------------------------------------ No results for input(s): HGBA1C in the last 72 hours. ------------------------------------------------------------------------------------------------------------------ No results for input(s): CHOL, HDL, LDLCALC, TRIG, CHOLHDL, LDLDIRECT in the last 72 hours. ------------------------------------------------------------------------------------------------------------------ No results for input(s): TSH, T4TOTAL, T3FREE, THYROIDAB in the last 72 hours.  Invalid input(s): FREET3 ------------------------------------------------------------------------------------------------------------------ No  results for input(s): VITAMINB12, FOLATE, FERRITIN, TIBC, IRON, RETICCTPCT in the last 72 hours.  Coagulation profile No results for input(s): INR, PROTIME in the last 168 hours.  No results for input(s): DDIMER in the last 72 hours.  Cardiac Enzymes Recent Labs  Lab 09/09/17 1836  TROPONINI <0.03   ------------------------------------------------------------------------------------------------------------------ Invalid input(s): POCBNP    Assessment & Plan   47 year old female patient with history of tobacco abuse, bronchial asthma, hypertension is admitted under hospitalist service for increased shortness of breath, cough and wheezing.  1.  New onset COPD with respiratory distress and hypoxia Continue IV Solu-Medrol Aggressive nebulization treatments Antitussive medication Wean oxygen if possible  2.  Acute bronchitis Continue azithromycin  antibiotic  3.  Hypertension Continue amlodipine and Benzapril  4.  Hyperlipidemia  5.  DVT prophylaxis with subcu Lovenox 40 mg daily  6. Tobacco cessation counseled to patient for six minutes Nicotine patch offered Harmful effects of smoking explained to patient in detail     Code Status Orders  (From admission, onward)        Start     Ordered   09/10/17 0157  Full code  Continuous     09/10/17 0156    Code Status History    This patient has a current code status but no historical code status.      Time Spent in minutes   35  Greater than 50% of time spent in care coordination and counseling patient regarding the condition and plan of care.   Ihor Austin M.D on 09/10/2017 at 12:50 PM  Between 7am to 6pm - Pager - [phone]  After 6pm go to [email or web address] - Social research officer, government  [employer]   Office  [phone]

## 2017-09-10 NOTE — Progress Notes (Signed)
Lovenox changed to 40 mg BID for BMI >40 and CrCl >30. 

## 2017-09-10 NOTE — ED Notes (Signed)
Report to Vanessa, RN.

## 2017-09-11 LAB — CBC
HCT: 47.8 % — ABNORMAL HIGH (ref 35.0–47.0)
HEMOGLOBIN: 15.8 g/dL (ref 12.0–16.0)
MCH: 31.2 pg (ref 26.0–34.0)
MCHC: 33.1 g/dL (ref 32.0–36.0)
MCV: 94.3 fL (ref 80.0–100.0)
Platelets: 252 10*3/uL (ref 150–440)
RBC: 5.07 MIL/uL (ref 3.80–5.20)
RDW: 13.9 % (ref 11.5–14.5)
WBC: 20.8 10*3/uL — ABNORMAL HIGH (ref 3.6–11.0)

## 2017-09-11 MED ORDER — NICOTINE 21 MG/24HR TD PT24: 21.0000 mg | MEDICATED_PATCH | Freq: Every day | TRANSDERMAL | 0 refills | Status: DC

## 2017-09-11 MED ORDER — PREDNISONE 10 MG PO TABS: 10.0000 mg | ORAL_TABLET | Freq: Every day | ORAL | 0 refills | Status: DC

## 2017-09-11 MED ORDER — AZITHROMYCIN 500 MG PO TABS: 250.0000 mg | ORAL_TABLET | Freq: Every day | ORAL | 0 refills | Status: AC

## 2017-09-11 NOTE — Progress Notes (Signed)
Advanced care plan.  Purpose of the Encounter: CODE STATUS  Parties in Attendance:  Patient's Decision Capacity:  Subjective/Patient's story: Patient came for shortness of breath and wheezing    Objective/Medical story History of active tobacco abuse Admitted for bronchitis and new onset copd    Goals of care determination: Advance directives discussed Patient wants everything done : cardiac resuscitation, intubation, ventilator mgmt if need arises    CODE STATUS: Full code   Time spent discussing advanced care planning: 16 minutes

## 2017-09-11 NOTE — Progress Notes (Addendum)
Patient now on Room air O2 Sats maintaining above 92% at rest

## 2017-09-11 NOTE — Care Management (Signed)
Patient qualifies for home O2. MD recommends patient discharge with it. Spoke with patients spouse, patient receiving a breathing treatment. He will speak with patient and RNCM will follow up.   [facility] that [facility] has refused home O2. Patient will be discharge home with no oxygen at her request.

## 2017-09-11 NOTE — Discharge Summary (Signed)
[employer] - [place] at [facility] 47 y.o., DOB 08-17-1970, MRN [id number]. Admission date: 09/09/2017 Discharge Date 09/11/2017 Primary MD Margaretann Loveless, MD Admitting Physician Oralia Manis, MD  Admission Diagnosis   1.  Acute COPD exacerbation 2.  Hypertension 3 hyperlipidemia. 4.  Anxiety disorder 5.Tobacco abuse  Discharge Diagnosis       1.  Acute COPD exacerbation resolved 2.  Hyperlipidemia 3.  Hypertension  4.anxiety disorder 5.  Tobacco abuse  Hospital Course  47 year old middle-aged female patient with history of hypertension, hyperlipidemia, anxiety disorder, tobacco abuse is admitted for shortness of breath, wheezing on 09/10/2017.  Patient admitted for new onset COPD with exacerbation she was put on IV Solu-Medrol and nebulization treatments aggressively.  Patient received a Zithromax antibiotic for bronchitis tobacco cessation was counseled to the patient.. DVT prophylaxis was given with subcu Lovenox 40 mg daily.  Shortness of breath and wheezing improved.  Patient was weaned off oxygen via nasal cannula.  She will be discharged home on oral steroids and inhaler treatments.  Follow-up with primary care physician in the clinic.  Patient hemodynamically stable will be discharged home.   Consults  None  Significant Tests:  See full reports for all details    Dg Chest 2 View  Result Date: 09/09/2017 CLINICAL DATA:  Cough and shortness of Breath EXAM: CHEST - 2 VIEW COMPARISON:  None. FINDINGS: Cardiac shadow is within normal limits. Some diffuse interstitial changes are noted without focal confluent infiltrate suggestive of bronchitis. Mild left basilar atelectasis is noted as well. No effusion is seen. No bony abnormality is noted. IMPRESSION: Changes of bronchitis with left basilar atelectasis. Electronically Signed   By: Alcide Clever M.D.   On: 09/09/2017 19:48       Today   Subjective:   Kayla Carlson is a 47 year old female  patient in no acute distress No shortness of breath No wheezing today Decreased cough  Objective:   Blood pressure 122/80, pulse 76, temperature 98.5 F (36.9 C), temperature source Oral, resp. rate 20, height 5\' 2"  (1.575 m), weight 106.6 kg (235 lb), SpO2 95 %.  .  Intake/Output Summary (Last 24 hours) at 09/11/2017 1511 Last data filed at 09/11/2017 1007 Gross per 24 hour  Intake 240 ml  Output -  Net 240 ml    Exam VITAL SIGNS: Blood pressure 122/80, pulse 76, temperature 98.5 F (36.9 C), temperature source Oral, resp. rate 20, height 5\' 2"  (1.575 m), weight 106.6 kg (235 lb), SpO2 95 %.  GENERAL:  47 y.o.-year-old patient lying in the bed with no acute distress.  EYES: Pupils equal, round, reactive to light and accommodation. No scleral icterus. Extraocular muscles intact.  HEENT: Head atraumatic, normocephalic. Oropharynx and nasopharynx clear.  NECK:  Supple, no jugular venous distention. No thyroid enlargement, no tenderness.  LUNGS: Normal breath sounds bilaterally, no wheezing, rales,rhonchi or crepitation. No use of accessory muscles of respiration.  CARDIOVASCULAR: S1, S2 normal. No murmurs, rubs, or gallops.  ABDOMEN: Soft, nontender, nondistended. Bowel sounds present. No organomegaly or mass.  EXTREMITIES: No pedal edema, cyanosis, or clubbing.  NEUROLOGIC: Cranial nerves II through XII are intact. Muscle strength 5/5 in all extremities. Sensation intact. Gait not checked.  PSYCHIATRIC: The patient is alert and oriented x 3.  SKIN: No obvious rash, lesion, or ulcer.   Data Review     CBC w Diff:  Lab Results  Component Value Date   WBC 20.8 (H) 09/11/2017   HGB 15.8 09/11/2017  HCT 47.8 (H) 09/11/2017   PLT 252 09/11/2017   CMP:  Lab Results  Component Value Date   NA 138 09/10/2017   K 3.8 09/10/2017   K 3.9 11/14/2011   CL 106 09/10/2017   CO2 24 09/10/2017   BUN 9 09/10/2017   CREATININE 0.74 09/10/2017  .  Micro Results No results found for  this or any previous visit (from the past 240 hour(s)).      Code Status Orders  (From admission, onward)        Start     Ordered   09/10/17 0157  Full code  Continuous     09/10/17 0156    Code Status History    This patient has a current code status but no historical code status.          Follow-up Information    Margaretann LovelessKhan, Neelam S, MD. Schedule an appointment as soon as possible for a visit on 09/18/2017.   Specialty:  Internal Medicine Why:  at [address] Contact information: [address] [place] [address] [phone]           Discharge Medications   Allergies as of 09/11/2017      Reactions   Codeine    Lisinopril       Medication List    STOP taking these medications   benazepril 10 MG tablet Commonly known as:  LOTENSIN     TAKE these medications   albuterol 108 (90 Base) MCG/ACT inhaler Commonly known as:  PROVENTIL HFA;VENTOLIN HFA Inhale 2 puffs into the lungs every 6 (six) hours as needed.   amLODipine 5 MG tablet Commonly known as:  NORVASC Take 1 tablet by mouth daily.   azithromycin 500 MG tablet Commonly known as:  ZITHROMAX Take 0.5 tablets (250 mg total) by mouth daily for 3 days.   nicotine 21 mg/24hr patch Commonly known as:  NICODERM CQ - dosed in mg/24 hours Place 1 patch (21 mg total) onto the skin daily.   predniSONE 10 MG tablet Commonly known as:  DELTASONE Take 1 tablet (10 mg total) by mouth daily. Label  & dispense according to the schedule below.  6 tablets day one, then 5 table day 2, then 4 tablets day 3, then 3 tablets day 4, 2 tablets day 5, then 1 tablet day 6, then stop   spironolactone 50 MG tablet Commonly known as:  ALDACTONE Take 1 tablet by mouth daily.   Vitamin D (Ergocalciferol) 50000 units Caps capsule Commonly known as:  DRISDOL Take 1 capsule by mouth every 7 (seven) days.          Total Time in preparing paper work, data evaluation and todays exam - 35 minutes  Ihor AustinPavan Maikayla Beggs M.D on  09/11/2017 at 3:11 PM [employer]   Office  [phone]

## 2017-09-11 NOTE — Progress Notes (Signed)
Discharge instructions and medication details reviewed with patient and husband. Printed prescriptions and AVS given to patient. Patient verbalizes understanding. All questions answered. IV removed Patient escorted out via wheelchair.

## 2017-09-11 NOTE — Progress Notes (Signed)
Patient's O2 Sats dropped to 87% with ambulation. MD Pyreddy paged and made aware. Per MD give patient a breathing TX and recheck O2  After breathing tx. Patient maintained O2 sats above 92% with ambulation.

## 2018-05-10 LAB — HIV ANTIBODY (ROUTINE TESTING W REFLEX): HIV Screen 4th Generation wRfx: NONREACTIVE

## 2018-09-10 ENCOUNTER — Other Ambulatory Visit: Payer: Self-pay | Admitting: Internal Medicine

## 2018-09-10 DIAGNOSIS — Z1231 Encounter for screening mammogram for malignant neoplasm of breast: Secondary | ICD-10-CM

## 2018-11-15 ENCOUNTER — Ambulatory Visit
Admission: RE | Admit: 2018-11-15 | Discharge: 2018-11-15 | Disposition: A | Discharge status: Home / Self Care | Payer: BLUE CROSS/BLUE SHIELD | Source: Ambulatory Visit | Attending: Internal Medicine | Admitting: Internal Medicine

## 2018-11-15 ENCOUNTER — Other Ambulatory Visit: Payer: Self-pay

## 2018-11-15 DIAGNOSIS — Z1231 Encounter for screening mammogram for malignant neoplasm of breast: Secondary | ICD-10-CM | POA: Diagnosis present

## 2018-11-16 ENCOUNTER — Other Ambulatory Visit: Payer: Self-pay | Admitting: Internal Medicine

## 2018-11-16 DIAGNOSIS — R928 Other abnormal and inconclusive findings on diagnostic imaging of breast: Secondary | ICD-10-CM

## 2018-11-16 DIAGNOSIS — N6489 Other specified disorders of breast: Secondary | ICD-10-CM

## 2018-11-23 ENCOUNTER — Ambulatory Visit
Admission: RE | Admit: 2018-11-23 | Discharge: 2018-11-23 | Disposition: A | Discharge status: Home / Self Care | Payer: BLUE CROSS/BLUE SHIELD | Source: Ambulatory Visit | Attending: Internal Medicine | Admitting: Internal Medicine

## 2018-11-23 ENCOUNTER — Other Ambulatory Visit: Payer: Self-pay

## 2018-11-23 DIAGNOSIS — N6489 Other specified disorders of breast: Secondary | ICD-10-CM | POA: Diagnosis present

## 2018-11-23 DIAGNOSIS — R928 Other abnormal and inconclusive findings on diagnostic imaging of breast: Secondary | ICD-10-CM | POA: Diagnosis not present

## 2019-11-17 ENCOUNTER — Other Ambulatory Visit: Payer: Self-pay

## 2019-11-17 DIAGNOSIS — N751 Abscess of Bartholin's gland: Secondary | ICD-10-CM | POA: Insufficient documentation

## 2019-11-17 DIAGNOSIS — I10 Essential (primary) hypertension: Secondary | ICD-10-CM | POA: Diagnosis not present

## 2019-11-17 DIAGNOSIS — F17200 Nicotine dependence, unspecified, uncomplicated: Secondary | ICD-10-CM | POA: Insufficient documentation

## 2019-11-17 LAB — CBC WITH DIFFERENTIAL/PLATELET
Abs Immature Granulocytes: 0.04 10*3/uL (ref 0.00–0.07)
Basophils Absolute: 0.1 10*3/uL (ref 0.0–0.1)
Basophils Relative: 1 %
Eosinophils Absolute: 0.1 10*3/uL (ref 0.0–0.5)
Eosinophils Relative: 1 %
HCT: 43 % (ref 36.0–46.0)
Hemoglobin: 14.9 g/dL (ref 12.0–15.0)
Immature Granulocytes: 0 %
Lymphocytes Relative: 15 %
Lymphs Abs: 1.9 10*3/uL (ref 0.7–4.0)
MCH: 31.4 pg (ref 26.0–34.0)
MCHC: 34.7 g/dL (ref 30.0–36.0)
MCV: 90.5 fL (ref 80.0–100.0)
Monocytes Absolute: 0.8 10*3/uL (ref 0.1–1.0)
Monocytes Relative: 6 %
Neutro Abs: 9.4 10*3/uL — ABNORMAL HIGH (ref 1.7–7.7)
Neutrophils Relative %: 77 %
Platelets: 283 10*3/uL (ref 150–400)
RBC: 4.75 MIL/uL (ref 3.87–5.11)
RDW: 12.8 % (ref 11.5–15.5)
WBC: 12.2 10*3/uL — ABNORMAL HIGH (ref 4.0–10.5)
nRBC: 0 % (ref 0.0–0.2)

## 2019-11-17 LAB — COMPREHENSIVE METABOLIC PANEL
ALT: 14 U/L (ref 0–44)
AST: 17 U/L (ref 15–41)
Albumin: 4.2 g/dL (ref 3.5–5.0)
Alkaline Phosphatase: 54 U/L (ref 38–126)
Anion gap: 10 (ref 5–15)
BUN: 9 mg/dL (ref 6–20)
CO2: 27 mmol/L (ref 22–32)
Calcium: 10.6 mg/dL — ABNORMAL HIGH (ref 8.9–10.3)
Chloride: 102 mmol/L (ref 98–111)
Creatinine, Ser: 0.8 mg/dL (ref 0.44–1.00)
GFR calc Af Amer: >60 mL/min (ref >60)
GFR calc non Af Amer: >60 mL/min (ref >60)
Glucose, Bld: 108 mg/dL — ABNORMAL HIGH (ref 70–99)
Potassium: 3.4 mmol/L — ABNORMAL LOW (ref 3.5–5.1)
Sodium: 139 mmol/L (ref 135–145)
Total Bilirubin: 0.9 mg/dL (ref 0.3–1.2)
Total Protein: 8 g/dL (ref 6.5–8.1)

## 2019-11-17 NOTE — ED Triage Notes (Addendum)
Patient c/o abscess to left vulvar area. Patient had prescription for doxycycline called in by MD. Patient has been on for 1 1/2 days with no relief of symptoms.

## 2019-11-18 ENCOUNTER — Emergency Department
Admission: EM | Admit: 2019-11-18 | Discharge: 2019-11-18 | Disposition: A | Discharge status: Home / Self Care | Payer: BLUE CROSS/BLUE SHIELD | Attending: Emergency Medicine | Admitting: Emergency Medicine

## 2019-11-18 DIAGNOSIS — N751 Abscess of Bartholin's gland: Secondary | ICD-10-CM

## 2019-11-18 MED ORDER — SULFAMETHOXAZOLE-TRIMETHOPRIM 800-160 MG PO TABS: 1.0000 | ORAL_TABLET | Freq: Two times a day (BID) | ORAL | 0 refills | Status: AC

## 2019-11-18 MED ORDER — LIDOCAINE HCL (PF) 1 % IJ SOLN: 5.0000 mL | Freq: Once | INTRAMUSCULAR | Status: AC

## 2019-11-18 MED ORDER — SODIUM CHLORIDE 0.9 % IV SOLN: 3.0000 g | Freq: Once | INTRAVENOUS | Status: DC

## 2019-11-18 MED ORDER — ETOMIDATE 2 MG/ML IV SOLN
10.0000 mg | Freq: Once | INTRAVENOUS | Status: AC
  Administered 2019-11-18: 10 mg via INTRAVENOUS
  Filled 2019-11-18: qty 10

## 2019-11-18 MED ORDER — PENTAFLUOROPROP-TETRAFLUOROETH EX AERO
INHALATION_SPRAY | CUTANEOUS | Status: DC | PRN
  Filled 2019-11-18: qty 30

## 2019-11-18 MED ORDER — ONDANSETRON HCL 4 MG/2ML IJ SOLN
4.0000 mg | Freq: Once | INTRAMUSCULAR | Status: AC
  Administered 2019-11-18: 4 mg via INTRAVENOUS
  Filled 2019-11-18: qty 2

## 2019-11-18 MED ORDER — LIDOCAINE HCL (PF) 1 % IJ SOLN
INTRAMUSCULAR | Status: AC
  Administered 2019-11-18: 5 mL via INTRADERMAL
  Filled 2019-11-18: qty 5

## 2019-11-18 NOTE — ED Provider Notes (Signed)
[facility] Emergency Department Provider Note  ____________________________________________   First MD Initiated Contact with Patient 11/18/19 0214     (approximate)  I have reviewed the triage vital signs and the nursing notes.   HISTORY  Chief Complaint Abscess    HPI Kayla Carlson is a 49 y.o. female presents to the emergency department secondary to an abscess in her vaginal area which patient states has been progressively worsening over the past week.  Patient states that she contacted her primary care provider who prescribed doxycycline however symptoms is progressively worsened and she was notified by primary care provider that they do not deal with this particular ailment..  Patient denies any fever afebrile on presentation.  Patient states current pain score 7 out of 10 but states that she does not want any medication for pain.        Past Medical History:  Diagnosis Date  . Asthma   . Hypertension     Patient Active Problem List   Diagnosis Date Noted  . HTN (hypertension) 09/10/2017  . COPD exacerbation (HCC) 09/10/2017  . HLD (hyperlipidemia) 09/10/2017  . Anxiety 09/10/2017  . Respiratory distress 09/10/2017    Past Surgical History:  Procedure Laterality Date  . ABDOMINAL HYSTERECTOMY  2010  . BREAST EXCISIONAL BIOPSY Left 2013    Prior to Admission medications   Medication Sig Start Date End Date Taking? Authorizing Provider  albuterol (PROVENTIL HFA;VENTOLIN HFA) 108 (90 Base) MCG/ACT inhaler Inhale 2 puffs into the lungs every 6 (six) hours as needed. 09/02/17   [provider]  amLODipine (NORVASC) 5 MG tablet Take 1 tablet by mouth daily. 06/03/17   [provider]  nicotine (NICODERM CQ - DOSED IN MG/24 HOURS) 21 mg/24hr patch Place 1 patch (21 mg total) onto the skin daily. 09/11/17   Ihor Austin, MD  predniSONE (DELTASONE) 10 MG tablet Take 1 tablet (10 mg total) by mouth daily. Label  & dispense  according to the schedule below.  6 tablets day one, then 5 table day 2, then 4 tablets day 3, then 3 tablets day 4, 2 tablets day 5, then 1 tablet day 6, then stop 09/11/17   Ihor Austin, MD  spironolactone (ALDACTONE) 50 MG tablet Take 1 tablet by mouth daily. 08/01/17   [provider]  Vitamin D, Ergocalciferol, (DRISDOL) 50000 units CAPS capsule Take 1 capsule by mouth every 7 (seven) days. 09/03/17   [provider]    Allergies Codeine and Lisinopril  Family History  Problem Relation Age of Onset  . Breast cancer Mother 63    Social History Social History   Tobacco Use  . Smoking status: Current Every Day Smoker  . Smokeless tobacco: Never Used  Substance Use Topics  . Alcohol use: Never  . Drug use: Not on file    Review of Systems Constitutional: No fever/chills Eyes: No visual changes. ENT: No sore throat. Cardiovascular: Denies chest pain. Respiratory: Denies shortness of breath. Gastrointestinal: No abdominal pain.  No nausea, no vomiting.  No diarrhea.  No constipation. Genitourinary: Negative for dysuria.  Today for vaginal abscess Musculoskeletal: Negative for neck pain.  Negative for back pain. Integumentary: Negative for rash. Neurological: Negative for headaches, focal weakness or numbness.  ____________________________________________   PHYSICAL EXAM:  VITAL SIGNS: ED Triage Vitals  Enc Vitals Group     BP 11/17/19 2032 (!) 159/81     Pulse Rate 11/17/19 2032 (!) 127     Resp 11/17/19 2032 18  Temp 11/17/19 2032 99.2 F (37.3 C)     Temp Source 11/18/19 0143 Oral     SpO2 11/17/19 2032 94 %     Weight 11/17/19 2032 117.9 kg (260 lb)     Height 11/17/19 2032 1.575 m (5\' 2" )     Head Circumference --      Peak Flow --      Pain Score 11/17/19 2033 7     Pain Loc --      Pain Edu? --      Excl. in [place]? --     Constitutional: Alert and oriented.  Eyes: Conjunctivae are normal.  Head: Atraumatic. Mouth/Throat: Patient is  wearing a mask. Neck: No stridor.  No meningeal signs.   Cardiovascular: Normal rate, regular rhythm. Good peripheral circulation. Grossly normal heart sounds. Respiratory: Normal respiratory effort.  No retractions. Gastrointestinal: Soft and nontender. No distention.  Genitourinary: Golf ball size left Bartholin gland abscess noted area tender to touch Musculoskeletal: No lower extremity tenderness nor edema. No gross deformities of extremities. Neurologic:  Normal speech and language. No gross focal neurologic deficits are appreciated.  Skin:  Skin is warm, dry and intact. Psychiatric: Anxious affect. Speech and behavior are normal.  ____________________________________________   LABS (all labs ordered are listed, but only abnormal results are displayed)  Labs Reviewed  COMPREHENSIVE METABOLIC PANEL - Abnormal; Notable for the following components:      Result Value   Potassium 3.4 (*)    Glucose, Bld 108 (*)    Calcium 10.6 (*)    All other components within normal limits  CBC WITH DIFFERENTIAL/PLATELET - Abnormal; Notable for the following components:   WBC 12.2 (*)    Neutro Abs 9.4 (*)    All other components within normal limits     .Marland KitchenIncision and Drainage  Date/Time: 11/19/2019 12:48 AM Performed by: Gregor Hams, MD Authorized by: Gregor Hams, MD   Consent:    Consent obtained:  Verbal   Consent given by:  Patient   Risks discussed:  Bleeding, infection, incomplete drainage and pain   Alternatives discussed:  Alternative treatment, delayed treatment and observation Location:    Type:  Abscess   Location:  Anogenital   Anogenital location:  Bartholin's gland Pre-procedure details:    Skin preparation:  Betadine Anesthesia (see MAR for exact dosages):    Anesthesia method:  Local infiltration   Local anesthetic:  Lidocaine 1% w/o epi Procedure type:    Complexity:  Complex Procedure details:    Needle aspiration: no     Incision types:  Single  straight   Incision depth:  Subcutaneous   Scalpel blade:  11   Drainage:  Purulent and serosanguinous   Drainage amount:  Copious   Wound treatment: Word catheter.   Packing materials:  Word catheter Post-procedure details:    Patient tolerance of procedure:  Tolerated well, no immediate complications .Sedation  Date/Time: 11/19/2019 12:51 AM Performed by: Gregor Hams, MD Authorized by: Gregor Hams, MD   Consent:    Consent obtained:  Written (electronic informed consent)   Risks discussed:  Allergic reaction, dysrhythmia, inadequate sedation, nausea, vomiting, respiratory compromise necessitating ventilatory assistance and intubation, prolonged sedation necessitating reversal and prolonged hypoxia resulting in organ damage Universal protocol:    Procedure explained and questions answered to patient or proxy's satisfaction: yes     Relevant documents present and verified: yes     Test results available and properly labeled: yes  Imaging studies available: yes     Required blood products, implants, devices, and special equipment available: yes     Immediately prior to procedure a time out was called: yes     Patient identity confirmation method:  Arm band Pre-sedation assessment:    Time since last food or drink:  6pm   ASA classification: class 1 - normal, healthy patient     Mallampati score:  I - soft palate, uvula, fauces, pillars visible   Pre-sedation assessments completed and reviewed: airway patency, cardiovascular function, mental status and respiratory function   Immediate pre-procedure details:    Reassessment: Patient reassessed immediately prior to procedure     Reviewed: vital signs, relevant labs/tests and NPO status     Verified: bag valve mask available, emergency equipment available, intubation equipment available, IV patency confirmed, oxygen available, reversal medications available and suction available   Procedure details (see MAR for exact  dosages):    Preoxygenation:  Room air   Sedation:  Etomidate   Intended level of sedation: deep   Intra-procedure monitoring:  Blood pressure monitoring, continuous pulse oximetry, cardiac monitor, frequent vital sign checks and frequent LOC assessments   Total Provider sedation time (minutes):  10 Post-procedure details:    Attendance: Constant attendance by certified staff until patient recovered     Recovery: Patient returned to pre-procedure baseline     Post-sedation assessments completed and reviewed: airway patency, cardiovascular function, hydration status, mental status and respiratory function     Patient is stable for discharge or admission: yes     Patient tolerance:  Tolerated well, no immediate complications    ____________________________________________   INITIAL IMPRESSION / MDM / ASSESSMENT AND PLAN / ED COURSE  As part of my medical decision making, I reviewed the following data within the electronic MEDICAL RECORD NUMBER8 year old female presented with above-stated history and physical exam consistent with Bartholin gland abscess.  I&D performed as stated above with copious purulent drainage.  Patient was scribed Bactrim DS for home with recommendation to follow-up with Dr. Tiburcio Pea GYN.      ____________________________________________  FINAL CLINICAL IMPRESSION(S) / ED DIAGNOSES  Final diagnoses:  Bartholin's gland abscess     MEDICATIONS GIVEN DURING THIS VISIT:  Medications  pentafluoroprop-tetrafluoroeth (GEBAUERS) aerosol (has no administration in time range)  Ampicillin-Sulbactam (UNASYN) 3 g in sodium chloride 0.9 % 100 mL IVPB (has no administration in time range)  lidocaine (PF) (XYLOCAINE) 1 % injection 5 mL (5 mLs Intradermal Given by Other 11/18/19 0336)  ondansetron (ZOFRAN) injection 4 mg (4 mg Intravenous Given 11/18/19 0334)  etomidate (AMIDATE) injection 10 mg (10 mg Intravenous Given 11/18/19 6967)     ED Discharge Orders    None       *Please note:  Kayla Carlson was evaluated in Emergency Department on 11/18/2019 for the symptoms described in the history of present illness. She was evaluated in the context of the global COVID-19 pandemic, which necessitated consideration that the patient might be at risk for infection with the SARS-CoV-2 virus that causes COVID-19. Institutional protocols and algorithms that pertain to the evaluation of patients at risk for COVID-19 are in a state of rapid change based on information released by regulatory bodies including the CDC and federal and state organizations. These policies and algorithms were followed during the patient's care in the ED.  Some ED evaluations and interventions may be delayed as a result of limited staffing during the pandemic.*  Note:  This document was prepared using Dragon voice  recognition software and may include unintentional dictation errors.   Darci Current, MD 11/19/19 [id number]

## 2019-11-18 NOTE — Sedation Documentation (Signed)
Pt able to give name upon questioning, abcess at right vulva incised, drained, and Word catheter placed

## 2019-11-19 ENCOUNTER — Encounter: Payer: Self-pay | Admitting: Obstetrics & Gynecology

## 2019-11-19 ENCOUNTER — Ambulatory Visit (INDEPENDENT_AMBULATORY_CARE_PROVIDER_SITE_OTHER): Payer: BLUE CROSS/BLUE SHIELD | Admitting: Obstetrics & Gynecology

## 2019-11-19 ENCOUNTER — Other Ambulatory Visit: Payer: Self-pay

## 2019-11-19 VITALS — BP 140/90 | Ht 62.0 in | Wt 260.0 lb

## 2019-11-19 DIAGNOSIS — N751 Abscess of Bartholin's gland: Secondary | ICD-10-CM | POA: Diagnosis not present

## 2019-11-19 NOTE — Progress Notes (Signed)
HPI:      [name] is a 49 y.o. , presents today for a problem visit.  She complains of:  Vulvar concern:   This is a 49 y.o. old Caucasian/White female who presents for the evaluation of a Bartholins abscess.  She noticed a left sided pea sized nodule one week ago, it started to grow.  Three days ago she noted pain.  Started on Doxy from PCP office call. One day ago she noted fever (101).  She went to ER and they dx abscess and I&D w word catheter placed, also IV ABx and started her on Bactrim for 10 days. Improved since yesterday.  Temp last night of 101 again.  Word catheter came out this am in shower.   PMHx: She  has a past medical history of Asthma and Hypertension. Also,  has a past surgical history that includes Abdominal hysterectomy (2010) and Breast excisional biopsy (Left, 2013)., family history includes Breast cancer (age of onset: 9) in her mother.,  reports that she has been smoking. She has never used smokeless tobacco. She reports that she does not drink alcohol and does not use drugs.  She has a current medication list which includes the following prescription(s): albuterol, amlodipine, doxycycline, nicotine, prednisone, spironolactone, sulfamethoxazole-trimethoprim, and vitamin d (ergocalciferol). Also, is allergic to codeine and lisinopril.  Review of Systems  Constitutional: Negative for chills, fever and malaise/fatigue.  HENT: Negative for congestion, sinus pain and sore throat.   Eyes: Negative for blurred vision and pain.  Respiratory: Negative for cough and wheezing.   Cardiovascular: Negative for chest pain and leg swelling.  Gastrointestinal: Negative for abdominal pain, constipation, diarrhea, heartburn, nausea and vomiting.  Genitourinary: Negative for dysuria, frequency, hematuria and urgency.  Musculoskeletal: Negative for back pain, joint pain, myalgias and neck pain.  Skin: Negative for itching and rash.  Neurological: Negative for dizziness,  tremors and weakness.  Endo/Heme/Allergies: Does not bruise/bleed easily.  Psychiatric/Behavioral: Negative for depression. The patient is not nervous/anxious and does not have insomnia.     Objective: BP 140/90   Ht 5\' 2"  (1.575 m)   Wt 260 lb (117.9 kg)   BMI 47.55 kg/m  Physical Exam Constitutional:      General: She is not in acute distress.    Appearance: She is well-developed. She is obese.  Genitourinary:     Pelvic exam was performed with patient supine.     Vagina normal.        No vaginal erythema or bleeding.     Genitourinary Comments: Left soft and erythematous Bartholins but no firm mass and min drainage seen Cuff intact/ no lesions Absent uterus and cervix  HENT:     Head: Normocephalic and atraumatic.     Nose: Nose normal.  Abdominal:     General: There is no distension.     Palpations: Abdomen is soft.     Tenderness: There is no abdominal tenderness.  Musculoskeletal:        General: Normal range of motion.  Neurological:     Mental Status: She is alert and oriented to person, place, and time.     Cranial Nerves: No cranial nerve deficit.  Skin:    General: Skin is warm and dry.  Psychiatric:        Attention and Perception: Attention normal.        Mood and Affect: Mood normal.        Speech: Speech normal.        Behavior:  Behavior normal.        Cognition and Memory: Cognition normal.        Judgment: Judgment normal.     ASSESSMENT/PLAN:    Problem List Items Addressed This Visit      Genitourinary   Bartholin's gland abscess - Primary    Cont Bactrim.  Change to Keflex if poor response Cont daily (twice if possible) hygiene to include direct shower or sitz baths Abstinance advised F/u as needed  Barnett Applebaum, MD, [facility], [facility] 11/19/2019  8:47 AM

## 2020-08-09 IMAGING — MG DIGITAL SCREENING BILATERAL MAMMOGRAM WITH TOMO AND CAD
8 series · 8 of 24 positions shown · non-contrast
Comparison: Previous exam(s).

CLINICAL DATA: Screening.

EXAM:
DIGITAL SCREENING BILATERAL MAMMOGRAM WITH TOMO AND CAD

[R CC synth-2D]
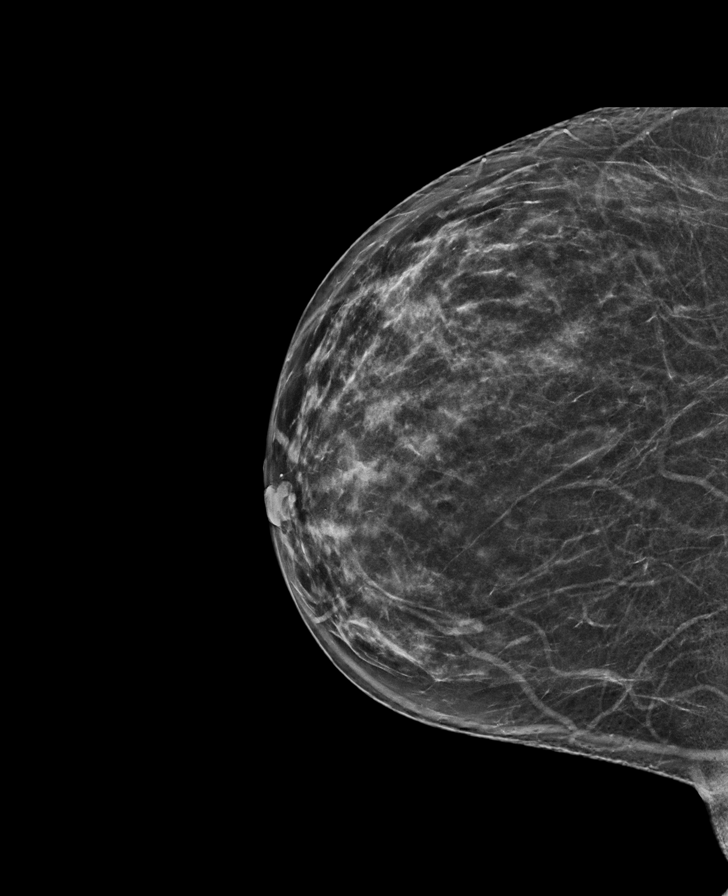

[L CC synth-2D]
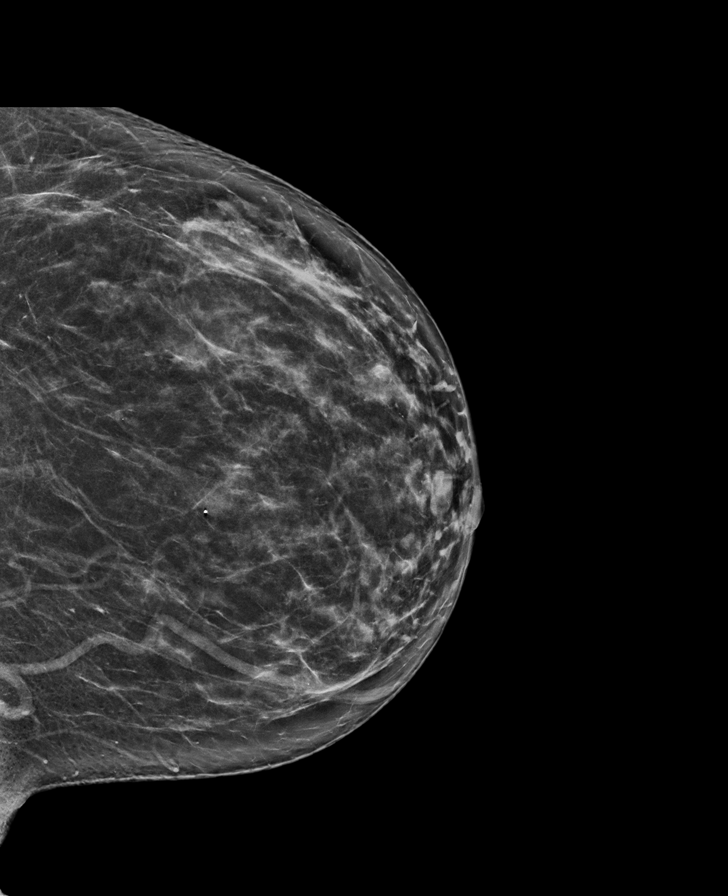

[L MLO synth-2D]
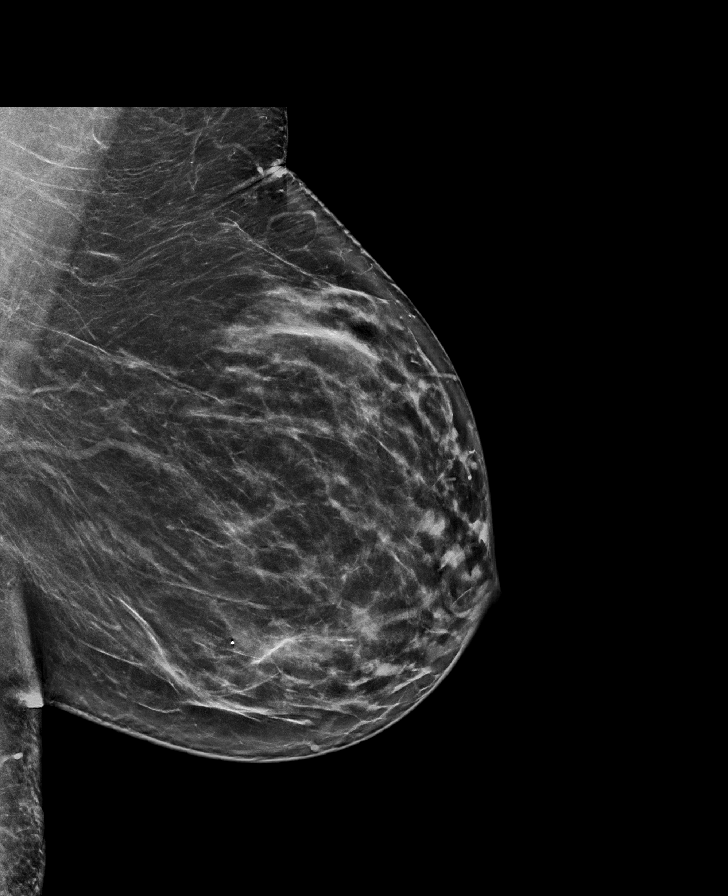

[R MLO synth-2D]
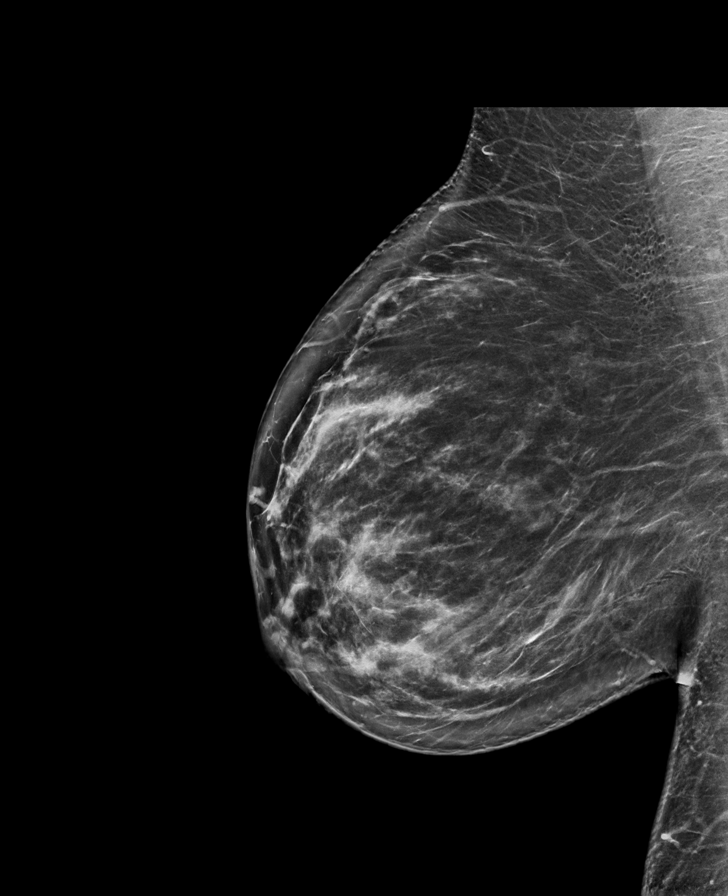

[L MLO tomo · tomo slice 44/87.0]
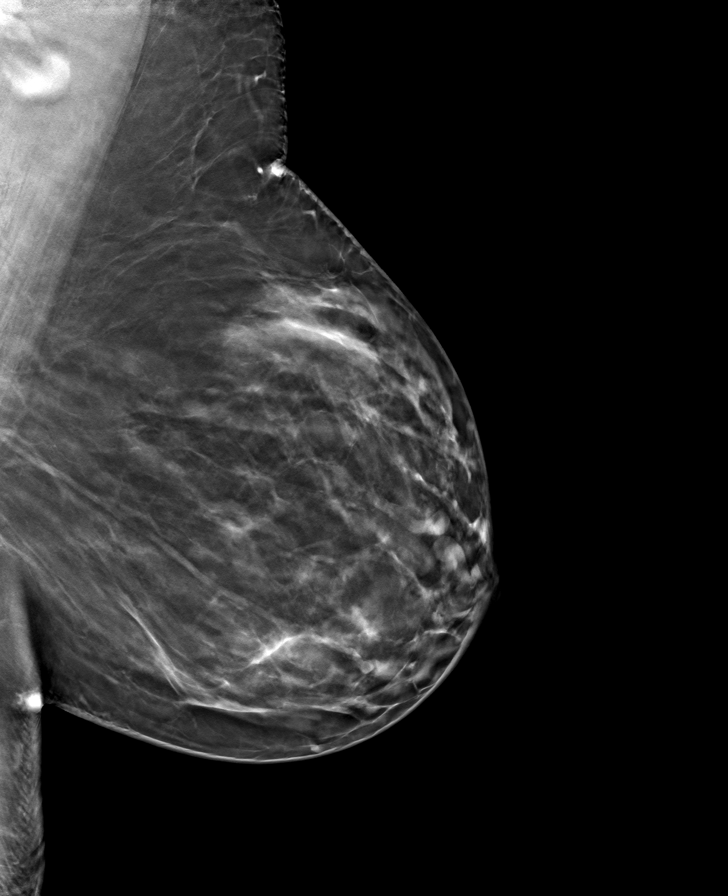

[L CC tomo · tomo slice 39/77.0]
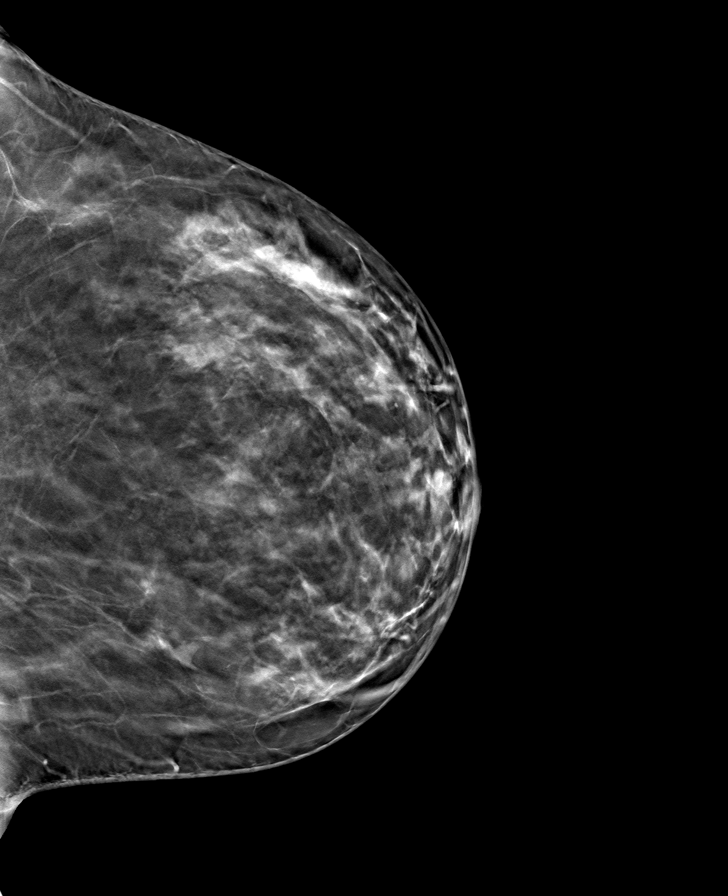

[R CC tomo · tomo slice 35/68.0]
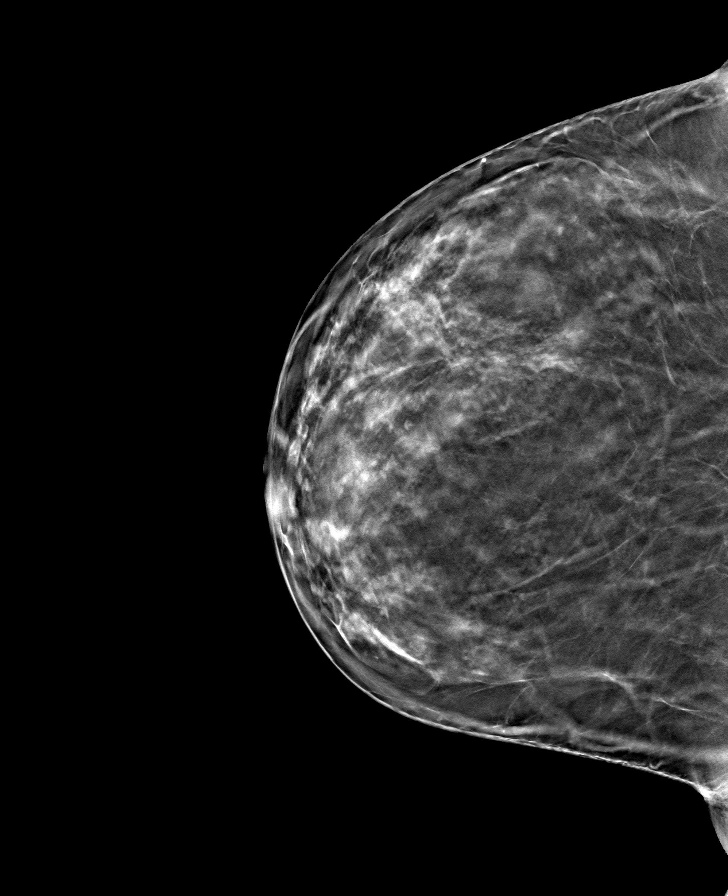

[R MLO tomo · tomo slice 46/91.0]
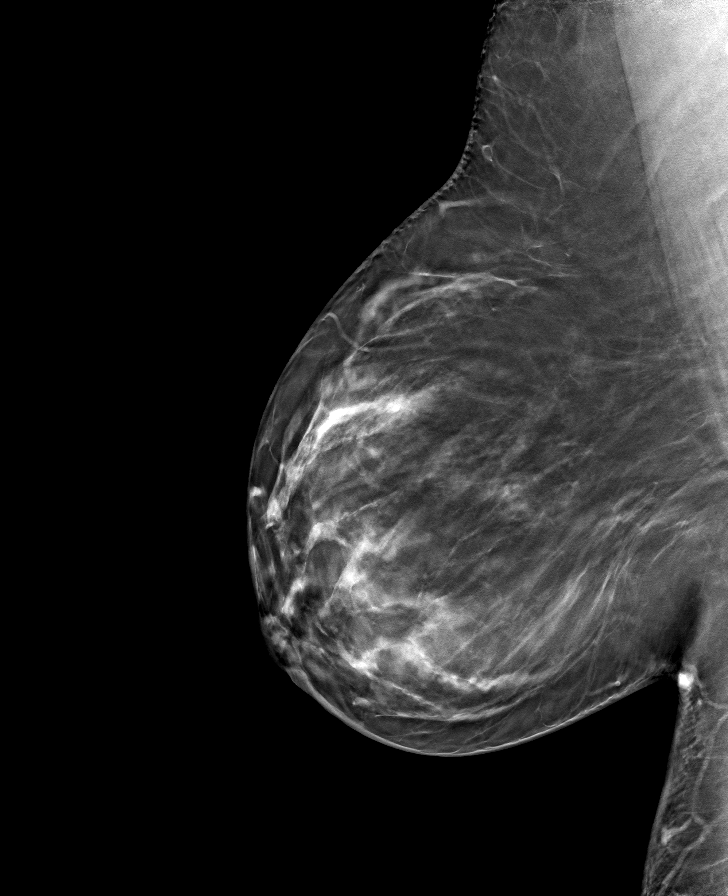

[8 of 24 positions shown; findings below may reference images not displayed]

ACR Breast Density Category c: The breast tissue is heterogeneously
dense, which may obscure small masses.
FINDINGS: In the right breast, a possible asymmetry warrants further
evaluation. In the left breast, no findings suspicious for
malignancy. Images were processed with CAD.
IMPRESSION: Further evaluation is suggested for possible asymmetry in the right
breast.

RECOMMENDATION:
Diagnostic mammogram and possibly ultrasound of the right breast.
(Code:EU-2-NNK)

The patient will be contacted regarding the findings, and additional
imaging will be scheduled.

BI-RADS CATEGORY  0: Incomplete. Need additional imaging evaluation
and/or prior mammograms for comparison.

## 2021-08-23 ENCOUNTER — Other Ambulatory Visit: Payer: Self-pay | Admitting: Internal Medicine

## 2021-08-23 DIAGNOSIS — Z1231 Encounter for screening mammogram for malignant neoplasm of breast: Secondary | ICD-10-CM

## 2021-10-01 ENCOUNTER — Ambulatory Visit
Admission: RE | Admit: 2021-10-01 | Discharge: 2021-10-01 | Disposition: A | Discharge status: Home / Self Care | Payer: BC Managed Care – PPO | Source: Ambulatory Visit | Attending: Internal Medicine | Admitting: Internal Medicine

## 2021-10-01 DIAGNOSIS — Z1231 Encounter for screening mammogram for malignant neoplasm of breast: Secondary | ICD-10-CM | POA: Diagnosis present

## 2021-10-04 ENCOUNTER — Other Ambulatory Visit: Payer: Self-pay | Admitting: Internal Medicine

## 2021-10-07 ENCOUNTER — Other Ambulatory Visit: Payer: Self-pay | Admitting: Internal Medicine

## 2021-10-07 DIAGNOSIS — R928 Other abnormal and inconclusive findings on diagnostic imaging of breast: Secondary | ICD-10-CM

## 2021-10-07 DIAGNOSIS — N63 Unspecified lump in unspecified breast: Secondary | ICD-10-CM

## 2021-10-14 ENCOUNTER — Ambulatory Visit
Admission: RE | Admit: 2021-10-14 | Discharge: 2021-10-14 | Disposition: A | Discharge status: Home / Self Care | Payer: BC Managed Care – PPO | Source: Ambulatory Visit | Attending: Internal Medicine | Admitting: Internal Medicine

## 2021-10-14 DIAGNOSIS — R928 Other abnormal and inconclusive findings on diagnostic imaging of breast: Secondary | ICD-10-CM

## 2021-10-14 DIAGNOSIS — N63 Unspecified lump in unspecified breast: Secondary | ICD-10-CM | POA: Diagnosis present

## 2021-10-22 ENCOUNTER — Other Ambulatory Visit: Payer: BC Managed Care – PPO

## 2022-07-26 ENCOUNTER — Other Ambulatory Visit: Payer: Self-pay | Admitting: Internal Medicine

## 2022-07-26 DIAGNOSIS — I10 Essential (primary) hypertension: Secondary | ICD-10-CM

## 2022-07-26 DIAGNOSIS — E782 Mixed hyperlipidemia: Secondary | ICD-10-CM

## 2022-08-05 ENCOUNTER — Other Ambulatory Visit: Payer: Self-pay | Admitting: Internal Medicine

## 2022-08-05 ENCOUNTER — Encounter: Payer: Self-pay | Admitting: Internal Medicine

## 2022-08-05 ENCOUNTER — Ambulatory Visit (INDEPENDENT_AMBULATORY_CARE_PROVIDER_SITE_OTHER): Payer: BC Managed Care – PPO | Admitting: Internal Medicine

## 2022-08-05 VITALS — BP 128/72 | HR 95 | Ht 62.0 in | Wt 218.6 lb

## 2022-08-05 DIAGNOSIS — E782 Mixed hyperlipidemia: Secondary | ICD-10-CM

## 2022-08-05 DIAGNOSIS — J45909 Unspecified asthma, uncomplicated: Secondary | ICD-10-CM | POA: Insufficient documentation

## 2022-08-05 DIAGNOSIS — Z9189 Other specified personal risk factors, not elsewhere classified: Secondary | ICD-10-CM

## 2022-08-05 DIAGNOSIS — I671 Cerebral aneurysm, nonruptured: Secondary | ICD-10-CM

## 2022-08-05 DIAGNOSIS — J4520 Mild intermittent asthma, uncomplicated: Secondary | ICD-10-CM

## 2022-08-05 DIAGNOSIS — K219 Gastro-esophageal reflux disease without esophagitis: Secondary | ICD-10-CM | POA: Insufficient documentation

## 2022-08-05 DIAGNOSIS — I10 Essential (primary) hypertension: Secondary | ICD-10-CM | POA: Diagnosis not present

## 2022-08-05 DIAGNOSIS — F419 Anxiety disorder, unspecified: Secondary | ICD-10-CM

## 2022-08-05 DIAGNOSIS — F17210 Nicotine dependence, cigarettes, uncomplicated: Secondary | ICD-10-CM

## 2022-08-05 MED ORDER — OSELTAMIVIR PHOSPHATE 75 MG PO CAPS: 75.0000 mg | ORAL_CAPSULE | Freq: Every day | ORAL | 0 refills | Status: AC

## 2022-08-05 NOTE — Progress Notes (Signed)
Established Patient Office Visit  Subjective:  Patient ID: Kayla Carlson, female    DOB: [birth year]  Age: 52 y.o. MRN: [id number]  No chief complaint on file.   Patient comes in with her husband today, she is very anxious and tearful.  3 days ago she was at her dentist office where they take a panoramic x-ray, and they found something which was suspicious for a stone in her right carotid artery?. Patient  was advised to follow-up with her primary care doctor.  Patient does not complain of any headaches or dizziness, no tingling numbness or motor weakness of the face or neck.  Patient does smoke cigarettes every day.  She is on simvastatin for her slightly elevated LDL cholesterol.  Her blood pressure is under good control. We will schedule a CT of her neck to identify the anomaly that was seen on x-ray of the neck.     Past Medical History:  Diagnosis Date   Asthma    GAD (generalized anxiety disorder)    GERD without esophagitis    Hypertension    Mixed hyperlipidemia    Past Surgical History:  Procedure Laterality Date   ABDOMINAL HYSTERECTOMY  2010   BREAST EXCISIONAL BIOPSY Left 2013    Social History   Socioeconomic History   Marital status: Married    Spouse name: Not on file   Number of children: Not on file   Years of education: Not on file   Highest education level: Not on file  Occupational History   Not on file  Tobacco Use   Smoking status: Every Day   Smokeless tobacco: Never  Substance and Sexual Activity   Alcohol use: Never   Drug use: Never   Sexual activity: Yes  Other Topics Concern   Not on file  Social History Narrative   Not on file   Social Determinants of Health   Financial Resource Strain: Not on file  Food Insecurity: Not on file  Transportation Needs: Not on file  Physical Activity: Not on file  Stress: Not on file  Social Connections: Not on file  Intimate Partner Violence: Not on file    Family History  Problem Relation  Age of Onset   Breast cancer Mother 50    Allergies  Allergen Reactions   Codeine    Lisinopril     Review of Systems  Constitutional: Negative.   HENT: Negative.    Eyes: Negative.   Respiratory: Negative.    Cardiovascular: Negative.   Gastrointestinal: Negative.   Genitourinary: Negative.   Musculoskeletal: Negative.   Skin: Negative.   Neurological: Negative.   Psychiatric/Behavioral:  Negative for depression, hallucinations, substance abuse and suicidal ideas. The patient is nervous/anxious.        Objective:   BP 128/72   Pulse 95   Ht '5\' 2"'$  (1.575 m)   Wt 218 lb 9.6 oz (99.2 kg)   SpO2 96%   BMI 39.98 kg/m   Vitals:   08/05/22 1317  BP: 128/72  Pulse: 95  Height: '5\' 2"'$  (1.575 m)  Weight: 218 lb 9.6 oz (99.2 kg)  SpO2: 96%  BMI (Calculated): 39.97    Physical Exam Vitals and nursing note reviewed.  Constitutional:      Appearance: Normal appearance. She is obese.  HENT:     Head: Normocephalic.  Neck:     Vascular: No carotid bruit.  Cardiovascular:     Rate and Rhythm: Normal rate.     Pulses: Normal pulses.  Heart sounds: Normal heart sounds.  Pulmonary:     Effort: Pulmonary effort is normal.     Breath sounds: Normal breath sounds.  Abdominal:     General: Abdomen is flat. Bowel sounds are normal.     Palpations: Abdomen is soft.  Musculoskeletal:        General: Normal range of motion.     Cervical back: Normal range of motion and neck supple. No rigidity or tenderness.  Lymphadenopathy:     Cervical: No cervical adenopathy.  Skin:    General: Skin is warm.  Neurological:     General: No focal deficit present.     Mental Status: She is alert and oriented to person, place, and time.      No results found for any visits on 08/05/22.  No results found for this or any previous visit (from the past 2160 hour(s)).    Assessment & Plan:  Schedule CT of the neck stat to evaluate a possible anomaly in her right side of the  neck. Problem List Items Addressed This Visit     HTN (hypertension)   HLD (hyperlipidemia)   Anxiety   GERD without esophagitis   Asthma without status asthmaticus   Other Visit Diagnoses     Right internal carotid artery aneurysm    -  Primary   Relevant Orders   CT Soft Tissue Neck W Contrast   Cigarette nicotine dependence without complication           Return in about 1 week (around 08/12/2022).   Total time spent: 30 minutes  Perrin Maltese, MD  08/05/2022

## 2022-08-08 ENCOUNTER — Ambulatory Visit (INDEPENDENT_AMBULATORY_CARE_PROVIDER_SITE_OTHER): Payer: BC Managed Care – PPO

## 2022-08-08 DIAGNOSIS — I671 Cerebral aneurysm, nonruptured: Secondary | ICD-10-CM

## 2022-08-08 MED ORDER — IOHEXOL 300 MG/ML  SOLN
100.0000 mL | Freq: Once | INTRAMUSCULAR | Status: AC | PRN
  Administered 2022-08-08: 100 mL via INTRAVENOUS

## 2022-08-11 ENCOUNTER — Other Ambulatory Visit: Payer: Self-pay | Admitting: Internal Medicine

## 2022-08-11 DIAGNOSIS — I6523 Occlusion and stenosis of bilateral carotid arteries: Secondary | ICD-10-CM

## 2022-08-11 DIAGNOSIS — I671 Cerebral aneurysm, nonruptured: Secondary | ICD-10-CM

## 2022-08-12 ENCOUNTER — Ambulatory Visit (INDEPENDENT_AMBULATORY_CARE_PROVIDER_SITE_OTHER): Payer: BC Managed Care – PPO | Admitting: Internal Medicine

## 2022-08-12 ENCOUNTER — Encounter: Payer: Self-pay | Admitting: Internal Medicine

## 2022-08-12 VITALS — BP 128/80 | HR 99 | Ht 64.0 in | Wt 280.8 lb

## 2022-08-12 DIAGNOSIS — I6523 Occlusion and stenosis of bilateral carotid arteries: Secondary | ICD-10-CM | POA: Diagnosis not present

## 2022-08-12 DIAGNOSIS — E782 Mixed hyperlipidemia: Secondary | ICD-10-CM | POA: Diagnosis not present

## 2022-08-12 DIAGNOSIS — F17210 Nicotine dependence, cigarettes, uncomplicated: Secondary | ICD-10-CM

## 2022-08-12 DIAGNOSIS — I10 Essential (primary) hypertension: Secondary | ICD-10-CM

## 2022-08-12 DIAGNOSIS — K219 Gastro-esophageal reflux disease without esophagitis: Secondary | ICD-10-CM

## 2022-08-12 NOTE — Progress Notes (Signed)
Established Patient Office Visit  Subjective:  Patient ID: Kayla Carlson, female    DOB: 10-19-1970  Age: 52 y.o. MRN: [id number]  Chief Complaint  Patient presents with   Follow-up    1 week follow up    Patient comes in for the follow-up of her CT scan of the neck.  Patient was told by her dentist that on her x-ray they had seen a stone in her neck on the right side.  The CT scan of the neck does not show any calcified mass or lymph nodes.  She does have some shotty lymphadenopathy.  A carotid ultrasound is also being scheduled for this patient.  She feels well, does not complain of any headaches or dizziness, she has high cholesterol and continues to smoke cigarettes.  Her labs are due at her next follow-up visit.     Past Medical History:  Diagnosis Date   Asthma    GAD (generalized anxiety disorder)    GERD without esophagitis    Hypertension    Mixed hyperlipidemia     Past Surgical History:  Procedure Laterality Date   ABDOMINAL HYSTERECTOMY  2010   BREAST EXCISIONAL BIOPSY Left 2013    Social History   Socioeconomic History   Marital status: Married    Spouse name: Not on file   Number of children: Not on file   Years of education: Not on file   Highest education level: Not on file  Occupational History   Not on file  Tobacco Use   Smoking status: Every Day   Smokeless tobacco: Never  Substance and Sexual Activity   Alcohol use: Never   Drug use: Never   Sexual activity: Yes  Other Topics Concern   Not on file  Social History Narrative   Not on file   Social Determinants of Health   Financial Resource Strain: Not on file  Food Insecurity: Not on file  Transportation Needs: Not on file  Physical Activity: Not on file  Stress: Not on file  Social Connections: Not on file  Intimate Partner Violence: Not on file    Family History  Problem Relation Age of Onset   Breast cancer Mother 3    Allergies  Allergen Reactions   Codeine     Lisinopril     Review of Systems  Constitutional: Negative.   HENT: Negative.    Eyes: Negative.   Respiratory: Negative.    Cardiovascular: Negative.   Gastrointestinal: Negative.   Genitourinary: Negative.   Musculoskeletal: Negative.   Skin: Negative.   Neurological: Negative.   Psychiatric/Behavioral: Negative.         Objective:   BP 128/80   Pulse 99   Ht '5\' 4"'$  (1.626 m)   Wt 280 lb 12.8 oz (127.4 kg)   SpO2 95%   BMI 48.20 kg/m   Vitals:   08/12/22 0914  BP: 128/80  Pulse: 99  Height: '5\' 4"'$  (1.626 m)  Weight: 280 lb 12.8 oz (127.4 kg)  SpO2: 95%  BMI (Calculated): 48.18    Physical Exam Vitals and nursing note reviewed.  Constitutional:      Appearance: Normal appearance. She is obese.  HENT:     Head: Normocephalic.  Cardiovascular:     Rate and Rhythm: Normal rate and regular rhythm.     Pulses: Normal pulses.     Heart sounds: Normal heart sounds.  Pulmonary:     Effort: Pulmonary effort is normal.     Breath sounds: Normal breath  sounds.  Abdominal:     General: Abdomen is flat. Bowel sounds are normal.     Palpations: Abdomen is soft.  Musculoskeletal:        General: Normal range of motion.     Cervical back: Normal range of motion and neck supple.  Neurological:     General: No focal deficit present.     Mental Status: She is alert and oriented to person, place, and time.      No results found for any visits on 08/12/22.  No results found for this or any previous visit (from the past 2160 hour(s)).    Assessment & Plan:  Patient will be getting a carotid ultrasound.  Will discussed the results and check labs at the next visit. Problem List Items Addressed This Visit     Essential hypertension, benign   HLD (hyperlipidemia)   GERD without esophagitis   Cigarette nicotine dependence without complication   Other Visit Diagnoses     Atherosclerosis of both carotid arteries    -  Primary       Follow up as  scheduled.  Total time spent: 20 minutes  Perrin Maltese, MD  08/12/2022

## 2022-08-17 ENCOUNTER — Ambulatory Visit (INDEPENDENT_AMBULATORY_CARE_PROVIDER_SITE_OTHER): Payer: BC Managed Care – PPO

## 2022-08-17 DIAGNOSIS — I6523 Occlusion and stenosis of bilateral carotid arteries: Secondary | ICD-10-CM | POA: Diagnosis not present

## 2022-08-24 ENCOUNTER — Other Ambulatory Visit: Payer: Self-pay | Admitting: Internal Medicine

## 2022-08-24 DIAGNOSIS — J4520 Mild intermittent asthma, uncomplicated: Secondary | ICD-10-CM

## 2022-10-19 ENCOUNTER — Other Ambulatory Visit: Payer: Self-pay | Admitting: Internal Medicine

## 2022-10-19 DIAGNOSIS — J4520 Mild intermittent asthma, uncomplicated: Secondary | ICD-10-CM

## 2022-11-10 ENCOUNTER — Ambulatory Visit: Payer: BC Managed Care – PPO | Admitting: Internal Medicine

## 2022-11-15 ENCOUNTER — Other Ambulatory Visit: Payer: Self-pay | Admitting: Internal Medicine

## 2022-12-14 ENCOUNTER — Other Ambulatory Visit: Payer: Self-pay | Admitting: Internal Medicine

## 2022-12-14 DIAGNOSIS — J4520 Mild intermittent asthma, uncomplicated: Secondary | ICD-10-CM

## 2023-02-09 ENCOUNTER — Other Ambulatory Visit: Payer: Self-pay | Admitting: Internal Medicine

## 2023-02-09 DIAGNOSIS — J4520 Mild intermittent asthma, uncomplicated: Secondary | ICD-10-CM

## 2023-02-10 ENCOUNTER — Ambulatory Visit: Payer: BC Managed Care – PPO | Admitting: Internal Medicine

## 2023-03-21 ENCOUNTER — Telehealth: Payer: Self-pay | Admitting: Internal Medicine

## 2023-03-21 NOTE — Telephone Encounter (Signed)
Patient left VM that she went to [facility] last night and was told she has cellulitis. She is wanting to know should she have it lanced? Please advise.

## 2023-03-24 ENCOUNTER — Telehealth: Payer: Self-pay | Admitting: Internal Medicine

## 2023-03-24 NOTE — Telephone Encounter (Signed)
Patient left VM requesting something be sent in for anxiety. States she has been having anxiety/panic attacks. Please advise.

## 2023-03-30 ENCOUNTER — Other Ambulatory Visit: Payer: Self-pay | Admitting: Internal Medicine

## 2023-03-30 DIAGNOSIS — J4520 Mild intermittent asthma, uncomplicated: Secondary | ICD-10-CM

## 2023-05-19 ENCOUNTER — Ambulatory Visit: Payer: BC Managed Care – PPO | Admitting: Internal Medicine

## 2023-05-23 ENCOUNTER — Other Ambulatory Visit: Payer: Self-pay | Admitting: Family

## 2023-05-23 DIAGNOSIS — J4520 Mild intermittent asthma, uncomplicated: Secondary | ICD-10-CM

## 2023-05-26 ENCOUNTER — Encounter: Payer: Self-pay | Admitting: Internal Medicine

## 2023-05-26 ENCOUNTER — Ambulatory Visit (INDEPENDENT_AMBULATORY_CARE_PROVIDER_SITE_OTHER): Payer: BC Managed Care – PPO | Admitting: Internal Medicine

## 2023-05-26 ENCOUNTER — Other Ambulatory Visit: Payer: Self-pay | Admitting: Internal Medicine

## 2023-05-26 VITALS — BP 138/86 | HR 97 | Ht 62.0 in | Wt 288.8 lb

## 2023-05-26 DIAGNOSIS — Z720 Tobacco use: Secondary | ICD-10-CM

## 2023-05-26 DIAGNOSIS — E782 Mixed hyperlipidemia: Secondary | ICD-10-CM | POA: Diagnosis not present

## 2023-05-26 DIAGNOSIS — E559 Vitamin D deficiency, unspecified: Secondary | ICD-10-CM | POA: Diagnosis not present

## 2023-05-26 DIAGNOSIS — I10 Essential (primary) hypertension: Secondary | ICD-10-CM | POA: Diagnosis not present

## 2023-05-26 DIAGNOSIS — F17210 Nicotine dependence, cigarettes, uncomplicated: Secondary | ICD-10-CM | POA: Diagnosis not present

## 2023-05-26 DIAGNOSIS — F17209 Nicotine dependence, unspecified, with unspecified nicotine-induced disorders: Secondary | ICD-10-CM | POA: Insufficient documentation

## 2023-05-26 DIAGNOSIS — Z1231 Encounter for screening mammogram for malignant neoplasm of breast: Secondary | ICD-10-CM

## 2023-05-26 NOTE — Progress Notes (Signed)
Established Patient Office Visit  Subjective:  Patient ID: Kayla Carlson, female    DOB: [birth year]  Age: 52 y.o. MRN: [id number]  No chief complaint on file.   Patient comes in for her follow-up today.  She has not been in since March of this year.  She has been undergoing treatment for her left fourth toe cellulitis, abscess and ulceration for the last few months.  She is under care of podiatry and is being treated with antibiotics and local care.  It is still not completely healed.  However she is feeling better now.  Her blood pressure is slightly high, she is fasting for blood work. Needs to be scheduled for mammogram. Wants to wait on colon cancer screening. Admits to smoking 1 pack/day, agrees to try cutting back. Will schedule her ABI at next visit.    No other concerns at this time.   Past Medical History:  Diagnosis Date   Asthma    GAD (generalized anxiety disorder)    GERD without esophagitis    Hypertension    Mixed hyperlipidemia     Past Surgical History:  Procedure Laterality Date   ABDOMINAL HYSTERECTOMY  2010   BREAST EXCISIONAL BIOPSY Left 2013    Social History   Socioeconomic History   Marital status: Married    Spouse name: Not on file   Number of children: Not on file   Years of education: Not on file   Highest education level: Not on file  Occupational History   Not on file  Tobacco Use   Smoking status: Every Day   Smokeless tobacco: Never  Substance and Sexual Activity   Alcohol use: Never   Drug use: Never   Sexual activity: Yes  Other Topics Concern   Not on file  Social History Narrative   Not on file   Social Drivers of Health   Financial Resource Strain: Not on file  Food Insecurity: Not on file  Transportation Needs: Not on file  Physical Activity: Not on file  Stress: Not on file  Social Connections: Not on file  Intimate Partner Violence: Not on file    Family History  Problem Relation Age of Onset   Breast  cancer Mother 42    Allergies  Allergen Reactions   Codeine    Doxycycline Itching and Swelling    Tongue turned   Lisinopril     Outpatient Medications Prior to Visit  Medication Sig   albuterol (VENTOLIN HFA) 108 (90 Base) MCG/ACT inhaler INHALE 1 TO 2 PUFFS BY MOUTH EVERY 4 TO 6 HOURS AS NEEDED FOR SHORTNESS OF BREATH   amLODipine (NORVASC) 5 MG tablet TAKE 1 TABLET BY MOUTH EVERY DAY   benazepril (LOTENSIN) 10 MG tablet TAKE 1 TABLET BY MOUTH ONCE DAILY   mupirocin ointment (BACTROBAN) 2 % Apply 1 Application topically daily.   simvastatin (ZOCOR) 10 MG tablet TAKE 1 TABLET BY MOUTH EVERY DAY   spironolactone (ALDACTONE) 50 MG tablet TAKE 1 TABLET BY MOUTH EVERY DAY   Vitamin D, Ergocalciferol, (DRISDOL) 50000 units CAPS capsule Take 1 capsule by mouth every 7 (seven) days.   nicotine (NICODERM CQ - DOSED IN MG/24 HOURS) 21 mg/24hr patch Place 1 patch (21 mg total) onto the skin daily. (Patient not taking: Reported on 05/26/2023)   No facility-administered medications prior to visit.    Review of Systems  Constitutional: Negative.  Negative for chills, fever, malaise/fatigue and weight loss.  HENT: Negative.  Negative for ear discharge and sinus  pain.   Eyes: Negative.   Respiratory: Negative.  Negative for cough, shortness of breath and stridor.   Cardiovascular: Negative.  Negative for chest pain, palpitations and leg swelling.  Gastrointestinal: Negative.  Negative for abdominal pain, constipation, diarrhea, heartburn, nausea and vomiting.  Genitourinary: Negative.  Negative for dysuria and flank pain.  Musculoskeletal: Negative.  Negative for joint pain and myalgias.  Skin: Negative.   Neurological:  Negative for dizziness, tingling, sensory change, speech change and headaches.  Endo/Heme/Allergies: Negative.   Psychiatric/Behavioral: Negative.  Negative for depression and suicidal ideas. The patient is not nervous/anxious.        Objective:   BP 138/86   Pulse 97    Ht 5\' 2"  (1.575 m)   Wt 288 lb 12.8 oz (131 kg)   SpO2 97%   BMI 52.82 kg/m   Vitals:   05/26/23 0944  BP: 138/86  Pulse: 97  Height: 5\' 2"  (1.575 m)  Weight: 288 lb 12.8 oz (131 kg)  SpO2: 97%  BMI (Calculated): 52.81    Physical Exam Vitals and nursing note reviewed.  Constitutional:      Appearance: Normal appearance.  HENT:     Head: Normocephalic and atraumatic.     Nose: Nose normal.     Mouth/Throat:     Mouth: Mucous membranes are moist.     Pharynx: Oropharynx is clear.  Eyes:     Conjunctiva/sclera: Conjunctivae normal.     Pupils: Pupils are equal, round, and reactive to light.  Cardiovascular:     Rate and Rhythm: Normal rate and regular rhythm.     Pulses: Normal pulses.     Heart sounds: Normal heart sounds. No murmur heard. Pulmonary:     Effort: Pulmonary effort is normal.     Breath sounds: Normal breath sounds. No wheezing.  Abdominal:     General: Bowel sounds are normal.     Palpations: Abdomen is soft.     Tenderness: There is no abdominal tenderness. There is no right CVA tenderness or left CVA tenderness.  Musculoskeletal:        General: Normal range of motion.     Cervical back: Normal range of motion.     Right lower leg: No edema.     Left lower leg: No edema.  Skin:    General: Skin is warm and dry.  Neurological:     General: No focal deficit present.     Mental Status: She is alert and oriented to person, place, and time.  Psychiatric:        Mood and Affect: Mood normal.        Behavior: Behavior normal.      No results found for any visits on 05/26/23.  No results found for this or any previous visit (from the past 2160 hours).    Assessment & Plan:  Patient will try to stop smoking herself.  As her husband also smokes daily, Check labs today.  Monitor blood pressure and weight. Schedule mammogram Problem List Items Addressed This Visit     Essential hypertension, benign - Primary   Relevant Orders   CBC with  Diff   CMP14+EGFR   HLD (hyperlipidemia)   Relevant Orders   Lipid Panel w/o Chol/HDL Ratio   Cigarette nicotine dependence without complication   Relevant Orders   CBC with Diff   Tobacco use disorder, continuous   Other Visit Diagnoses       Vitamin D deficiency  Relevant Orders   Vitamin D (25 hydroxy)     Breast cancer screening by mammogram       Relevant Orders   MM 3D SCREENING MAMMOGRAM BILATERAL BREAST       Return in about 4 months (around 09/24/2023).   Total time spent: 30 minutes  Margaretann Loveless, MD  05/26/2023   This document may have been prepared by [facility] Voice Recognition software and as such may include unintentional dictation errors.

## 2023-05-27 LAB — CBC WITH DIFFERENTIAL/PLATELET
Basophils Absolute: 0.1 10*3/uL (ref 0.0–0.2)
Basos: 1 %
EOS (ABSOLUTE): 0.4 10*3/uL (ref 0.0–0.4)
Eos: 4 %
Hematocrit: 47.8 % — ABNORMAL HIGH (ref 34.0–46.6)
Hemoglobin: 15.6 g/dL (ref 11.1–15.9)
Immature Grans (Abs): 0 10*3/uL (ref 0.0–0.1)
Immature Granulocytes: 0 %
Lymphocytes Absolute: 2.8 10*3/uL (ref 0.7–3.1)
Lymphs: 29 %
MCH: 31.3 pg (ref 26.6–33.0)
MCHC: 32.6 g/dL (ref 31.5–35.7)
MCV: 96 fL (ref 79–97)
Monocytes Absolute: 0.6 10*3/uL (ref 0.1–0.9)
Monocytes: 6 %
Neutrophils Absolute: 5.9 10*3/uL (ref 1.4–7.0)
Neutrophils: 60 %
Platelets: 293 10*3/uL (ref 150–450)
RBC: 4.99 x10E6/uL (ref 3.77–5.28)
RDW: 12.5 % (ref 11.7–15.4)
WBC: 9.8 10*3/uL (ref 3.4–10.8)

## 2023-05-27 LAB — CMP14+EGFR
ALT: 23 [IU]/L (ref 0–32)
AST: 21 [IU]/L (ref 0–40)
Albumin: 4.5 g/dL (ref 3.8–4.9)
Alkaline Phosphatase: 64 [IU]/L (ref 44–121)
BUN/Creatinine Ratio: 9 (ref 9–23)
BUN: 8 mg/dL (ref 6–24)
Bilirubin Total: 0.3 mg/dL (ref 0.0–1.2)
CO2: 23 mmol/L (ref 20–29)
Calcium: 11.5 mg/dL — ABNORMAL HIGH (ref 8.7–10.2)
Chloride: 99 mmol/L (ref 96–106)
Creatinine, Ser: 0.85 mg/dL (ref 0.57–1.00)
Globulin, Total: 2.6 g/dL (ref 1.5–4.5)
Glucose: 94 mg/dL (ref 70–99)
Potassium: 4.2 mmol/L (ref 3.5–5.2)
Sodium: 139 mmol/L (ref 134–144)
Total Protein: 7.1 g/dL (ref 6.0–8.5)
eGFR: 82 mL/min/{1.73_m2} (ref >59)

## 2023-05-27 LAB — LIPID PANEL W/O CHOL/HDL RATIO
Cholesterol, Total: 158 mg/dL (ref 100–199)
HDL: 44 mg/dL (ref >39)
LDL Chol Calc (NIH): 88 mg/dL (ref 0–99)
Triglycerides: 151 mg/dL — ABNORMAL HIGH (ref 0–149)
VLDL Cholesterol Cal: 26 mg/dL (ref 5–40)

## 2023-05-27 LAB — VITAMIN D 25 HYDROXY (VIT D DEFICIENCY, FRACTURES): Vit D, 25-Hydroxy: 28 ng/mL — ABNORMAL LOW (ref 30.0–100.0)

## 2023-05-29 ENCOUNTER — Other Ambulatory Visit: Payer: Self-pay | Admitting: Internal Medicine

## 2023-06-02 ENCOUNTER — Other Ambulatory Visit: Payer: Self-pay | Admitting: Internal Medicine

## 2023-06-06 LAB — PTH, INTACT AND CALCIUM
Calcium: 10.5 mg/dL — ABNORMAL HIGH (ref 8.7–10.2)
PTH: 37 pg/mL (ref 15–65)

## 2023-06-19 ENCOUNTER — Other Ambulatory Visit: Payer: Self-pay | Admitting: Internal Medicine

## 2023-06-19 DIAGNOSIS — I10 Essential (primary) hypertension: Secondary | ICD-10-CM

## 2023-06-27 ENCOUNTER — Other Ambulatory Visit: Payer: BC Managed Care – PPO

## 2023-07-06 ENCOUNTER — Other Ambulatory Visit: Payer: Self-pay | Admitting: Internal Medicine

## 2023-07-06 DIAGNOSIS — E559 Vitamin D deficiency, unspecified: Secondary | ICD-10-CM

## 2023-07-13 ENCOUNTER — Other Ambulatory Visit: Payer: Self-pay | Admitting: Family

## 2023-07-13 DIAGNOSIS — J4520 Mild intermittent asthma, uncomplicated: Secondary | ICD-10-CM

## 2023-07-26 ENCOUNTER — Other Ambulatory Visit: Payer: Self-pay | Admitting: Internal Medicine

## 2023-07-26 DIAGNOSIS — I10 Essential (primary) hypertension: Secondary | ICD-10-CM

## 2023-07-26 DIAGNOSIS — E782 Mixed hyperlipidemia: Secondary | ICD-10-CM

## 2023-08-01 ENCOUNTER — Other Ambulatory Visit: Payer: BC Managed Care – PPO

## 2023-09-05 ENCOUNTER — Other Ambulatory Visit: Payer: Self-pay | Admitting: Internal Medicine

## 2023-09-05 DIAGNOSIS — J4520 Mild intermittent asthma, uncomplicated: Secondary | ICD-10-CM

## 2023-09-29 ENCOUNTER — Ambulatory Visit: Payer: BC Managed Care – PPO | Admitting: Internal Medicine

## 2023-10-24 ENCOUNTER — Other Ambulatory Visit: Payer: Self-pay | Admitting: Internal Medicine

## 2023-10-24 ENCOUNTER — Other Ambulatory Visit: Payer: Self-pay

## 2023-10-24 DIAGNOSIS — E782 Mixed hyperlipidemia: Secondary | ICD-10-CM

## 2023-10-24 DIAGNOSIS — J4520 Mild intermittent asthma, uncomplicated: Secondary | ICD-10-CM

## 2023-10-24 DIAGNOSIS — I10 Essential (primary) hypertension: Secondary | ICD-10-CM

## 2023-10-26 MED ORDER — SIMVASTATIN 10 MG PO TABS: 10.0000 mg | ORAL_TABLET | Freq: Every day | ORAL | 0 refills | Status: DC

## 2023-10-26 MED ORDER — BENAZEPRIL HCL 10 MG PO TABS: 10.0000 mg | ORAL_TABLET | Freq: Every day | ORAL | 0 refills | Status: DC

## 2023-11-10 ENCOUNTER — Other Ambulatory Visit: Payer: Self-pay

## 2023-11-10 MED ORDER — SPIRONOLACTONE 50 MG PO TABS: 50.0000 mg | ORAL_TABLET | Freq: Every day | ORAL | 0 refills | Status: DC

## 2023-11-14 ENCOUNTER — Other Ambulatory Visit: Payer: Self-pay

## 2023-11-14 MED ORDER — SPIRONOLACTONE 50 MG PO TABS: 50.0000 mg | ORAL_TABLET | Freq: Every day | ORAL | 0 refills | Status: DC

## 2023-11-17 ENCOUNTER — Ambulatory Visit
Admission: RE | Admit: 2023-11-17 | Discharge: 2023-11-17 | Disposition: A | Discharge status: Home / Self Care | Source: Ambulatory Visit | Attending: Internal Medicine | Admitting: Internal Medicine

## 2023-11-17 DIAGNOSIS — Z1231 Encounter for screening mammogram for malignant neoplasm of breast: Secondary | ICD-10-CM | POA: Diagnosis present

## 2023-11-21 ENCOUNTER — Ambulatory Visit: Payer: Self-pay | Admitting: Internal Medicine

## 2023-11-21 ENCOUNTER — Other Ambulatory Visit: Payer: Self-pay | Admitting: Internal Medicine

## 2023-11-21 DIAGNOSIS — R928 Other abnormal and inconclusive findings on diagnostic imaging of breast: Secondary | ICD-10-CM

## 2023-12-01 ENCOUNTER — Ambulatory Visit (INDEPENDENT_AMBULATORY_CARE_PROVIDER_SITE_OTHER): Admitting: Internal Medicine

## 2023-12-01 ENCOUNTER — Encounter: Payer: Self-pay | Admitting: Internal Medicine

## 2023-12-01 VITALS — BP 124/84 | HR 98 | Ht 62.0 in | Wt 295.0 lb

## 2023-12-01 DIAGNOSIS — E559 Vitamin D deficiency, unspecified: Secondary | ICD-10-CM | POA: Insufficient documentation

## 2023-12-01 DIAGNOSIS — R7309 Other abnormal glucose: Secondary | ICD-10-CM

## 2023-12-01 DIAGNOSIS — E782 Mixed hyperlipidemia: Secondary | ICD-10-CM

## 2023-12-01 DIAGNOSIS — I10 Essential (primary) hypertension: Secondary | ICD-10-CM | POA: Diagnosis not present

## 2023-12-01 MED ORDER — FUROSEMIDE 20 MG PO TABS: 20.0000 mg | ORAL_TABLET | Freq: Every day | ORAL | 11 refills | Status: AC

## 2023-12-01 NOTE — Progress Notes (Signed)
 Established Patient Office Visit  Subjective:  Patient ID: Kayla Carlson, female    DOB: 10-20-1970  Age: 53 y.o. MRN: [id number]  Chief Complaint  Patient presents with   Follow-up    4 month follow up    Patient comes in for follow-up today.  She has gained significant amount of weight but thinks its mostly due to fluid retention, recently due to heat as their [facility] unit not working.  Patient is anxious and tearful as she is also suffering from cellulitis of her right toe.  She continues to smoke cigarettes.  Recently had a mammogram and needs to go back for additional views.  Denies headaches or dizziness, no chest pain and no shortness of breath. Currently she is on spironolactone .  Will send in a prescription of Lasix 20 mg to be taken twice a day for 3 days and then once a day.  She can hold off the spironolactone  for the next 3 days and then resume. Check blood work today. Still needs to get the CT of the chest for smoking. Follow-up in 1 week, will also discuss other measures to help lose weight.    No other concerns at this time.   Past Medical History:  Diagnosis Date   Asthma    GAD (generalized anxiety disorder)    GERD without esophagitis    Hypertension    Mixed hyperlipidemia     Past Surgical History:  Procedure Laterality Date   ABDOMINAL HYSTERECTOMY  2010   BREAST EXCISIONAL BIOPSY Left 2013   benign    Social History   Socioeconomic History   Marital status: Married    Spouse name: Not on file   Number of children: Not on file   Years of education: Not on file   Highest education level: Not on file  Occupational History   Not on file  Tobacco Use   Smoking status: Every Day   Smokeless tobacco: Never  Substance and Sexual Activity   Alcohol use: Never   Drug use: Never   Sexual activity: Yes  Other Topics Concern   Not on file  Social History Narrative   Not on file   Social Drivers of Health   Financial Resource Strain: Low Risk   (08/10/2023)   Received from [facility]   Overall Financial Resource Strain (CARDIA)    Difficulty of Paying Living Expenses: Not hard at all  Food Insecurity: No Food Insecurity (08/10/2023)   Received from [facility]   Hunger Vital Sign    Within the past 12 months, you worried that your food would run out before you got the money to buy more.: Never true    Within the past 12 months, the food you bought just didn't last and you didn't have money to get more.: Never true  Transportation Needs: No Transportation Needs (08/10/2023)   Received from [facility] - Transportation    In the past 12 months, has lack of transportation kept you from medical appointments or from getting medications?: No    Lack of Transportation (Non-Medical): No  Physical Activity: Not on file  Stress: Not on file  Social Connections: Not on file  Intimate Partner Violence: Not on file    Family History  Problem Relation Age of Onset   Breast cancer Mother 28    Allergies  Allergen Reactions   Codeine    Doxycycline Itching and Swelling    Tongue turned  Lisinopril     Outpatient Medications Prior to Visit  Medication Sig   albuterol  (VENTOLIN  HFA) 108 (90 Base) MCG/ACT inhaler INHALE 1 TO 2 PUFFS BY MOUTH EVERY 4 TO 6 HOURS AS NEEDED FOR SHORTNESS OF BREATH   amLODipine  (NORVASC ) 5 MG tablet TAKE 1 TABLET BY MOUTH EVERY DAY   benazepril  (LOTENSIN ) 10 MG tablet Take 1 tablet (10 mg total) by mouth daily.   simvastatin  (ZOCOR ) 10 MG tablet Take 1 tablet (10 mg total) by mouth daily.   spironolactone  (ALDACTONE ) 50 MG tablet Take 1 tablet (50 mg total) by mouth daily.   Vitamin D , Ergocalciferol , (DRISDOL) 1.25 MG (50000 UNIT) CAPS capsule TAKE 1 CAPSULE BY MOUTH EVERY WEEK   mupirocin ointment (BACTROBAN) 2 % Apply 1 Application topically daily. (Patient not taking: Reported on 12/01/2023)   nicotine  (NICODERM CQ  - DOSED IN MG/24 HOURS) 21  mg/24hr patch Place 1 patch (21 mg total) onto the skin daily. (Patient not taking: Reported on 12/01/2023)   No facility-administered medications prior to visit.    Review of Systems  Constitutional: Negative.  Negative for chills, diaphoresis, fever, malaise/fatigue and weight loss.  HENT: Negative.  Negative for congestion and sore throat.   Eyes: Negative.   Respiratory: Negative.  Negative for cough and shortness of breath.   Cardiovascular: Negative.  Negative for chest pain, palpitations and leg swelling.  Gastrointestinal: Negative.  Negative for abdominal pain, constipation, diarrhea, heartburn, nausea and vomiting.  Genitourinary: Negative.  Negative for dysuria and flank pain.  Musculoskeletal: Negative.  Negative for joint pain and myalgias.  Skin: Negative.   Neurological: Negative.  Negative for dizziness, tingling, tremors and headaches.  Endo/Heme/Allergies: Negative.   Psychiatric/Behavioral: Negative.  Negative for depression and suicidal ideas. The patient is not nervous/anxious.        Objective:   BP 124/84   Pulse 98   Ht 5' 2 (1.575 m)   Wt 295 lb (133.8 kg)   SpO2 94%   BMI 53.96 kg/m   Vitals:   12/01/23 0933  BP: 124/84  Pulse: 98  Height: 5' 2 (1.575 m)  Weight: 295 lb (133.8 kg)  SpO2: 94%  BMI (Calculated): 53.94    Physical Exam Vitals and nursing note reviewed.  Constitutional:      Appearance: Normal appearance.  HENT:     Head: Normocephalic and atraumatic.     Nose: Nose normal.     Mouth/Throat:     Mouth: Mucous membranes are moist.     Pharynx: Oropharynx is clear.   Eyes:     Conjunctiva/sclera: Conjunctivae normal.     Pupils: Pupils are equal, round, and reactive to light.    Cardiovascular:     Rate and Rhythm: Normal rate and regular rhythm.     Pulses: Normal pulses.     Heart sounds: Normal heart sounds. No murmur heard. Pulmonary:     Effort: Pulmonary effort is normal.     Breath sounds: Normal breath  sounds. No wheezing.  Abdominal:     General: Bowel sounds are normal.     Palpations: Abdomen is soft.     Tenderness: There is no abdominal tenderness. There is no right CVA tenderness or left CVA tenderness.   Musculoskeletal:        General: Swelling present. No tenderness. Normal range of motion.     Cervical back: Normal range of motion.     Right lower leg: No edema.     Left lower leg: No edema.  Skin:    General: Skin is warm and dry.   Neurological:     General: No focal deficit present.     Mental Status: She is alert and oriented to person, place, and time.   Psychiatric:        Mood and Affect: Mood normal.        Behavior: Behavior normal.      No results found for any visits on 12/01/23.  No results found for this or any previous visit (from the past 2160 hours).    Assessment & Plan:  Medications adjusted.  Check labs.  Return in 1 week to discuss results.  Strict diet control emphasized. Problem List Items Addressed This Visit     Essential hypertension, benign - Primary   Relevant Medications   furosemide (LASIX) 20 MG tablet   Other Relevant Orders   CMP14+EGFR   HLD (hyperlipidemia)   Relevant Medications   furosemide (LASIX) 20 MG tablet   Other Relevant Orders   Lipid Panel w/o Chol/HDL Ratio   Obesity, morbid, BMI 50 or higher (HCC)   Vitamin D  deficiency   Relevant Orders   Vitamin D  (25 hydroxy)   Other Visit Diagnoses       Elevated glucose       Relevant Orders   Hemoglobin A1c       Return in about 1 week (around 12/08/2023).   Total time spent: 30 minutes  FERNAND FREDY RAMAN, MD  12/01/2023   This document may have been prepared by [facility] Voice Recognition software and as such may include unintentional dictation errors.

## 2023-12-02 LAB — HEMOGLOBIN A1C
Est. average glucose Bld gHb Est-mCnc: 117 mg/dL
Hgb A1c MFr Bld: 5.7 % — ABNORMAL HIGH (ref 4.8–5.6)

## 2023-12-02 LAB — LIPID PANEL W/O CHOL/HDL RATIO
Cholesterol, Total: 151 mg/dL (ref 100–199)
HDL: 43 mg/dL (ref >39)
LDL Chol Calc (NIH): 84 mg/dL (ref 0–99)
Triglycerides: 135 mg/dL (ref 0–149)
VLDL Cholesterol Cal: 24 mg/dL (ref 5–40)

## 2023-12-02 LAB — CMP14+EGFR
ALT: 25 IU/L (ref 0–32)
AST: 21 IU/L (ref 0–40)
Albumin: 4.5 g/dL (ref 3.8–4.9)
Alkaline Phosphatase: 57 IU/L (ref 44–121)
BUN/Creatinine Ratio: 12 (ref 9–23)
BUN: 11 mg/dL (ref 6–24)
Bilirubin Total: 0.2 mg/dL (ref 0.0–1.2)
CO2: 23 mmol/L (ref 20–29)
Calcium: 11.6 mg/dL — ABNORMAL HIGH (ref 8.7–10.2)
Chloride: 100 mmol/L (ref 96–106)
Creatinine, Ser: 0.93 mg/dL (ref 0.57–1.00)
Globulin, Total: 2.7 g/dL (ref 1.5–4.5)
Glucose: 109 mg/dL — ABNORMAL HIGH (ref 70–99)
Potassium: 4.4 mmol/L (ref 3.5–5.2)
Sodium: 141 mmol/L (ref 134–144)
Total Protein: 7.2 g/dL (ref 6.0–8.5)
eGFR: 73 mL/min/{1.73_m2} (ref >59)

## 2023-12-02 LAB — VITAMIN D 25 HYDROXY (VIT D DEFICIENCY, FRACTURES): Vit D, 25-Hydroxy: 29 ng/mL — ABNORMAL LOW (ref 30.0–100.0)

## 2023-12-04 ENCOUNTER — Ambulatory Visit: Payer: Self-pay | Admitting: Internal Medicine

## 2023-12-07 ENCOUNTER — Ambulatory Visit (INDEPENDENT_AMBULATORY_CARE_PROVIDER_SITE_OTHER): Admitting: Internal Medicine

## 2023-12-07 ENCOUNTER — Encounter: Payer: Self-pay | Admitting: Internal Medicine

## 2023-12-07 VITALS — BP 120/82 | HR 96 | Ht 62.0 in | Wt 291.0 lb

## 2023-12-07 DIAGNOSIS — I10 Essential (primary) hypertension: Secondary | ICD-10-CM

## 2023-12-07 DIAGNOSIS — Z720 Tobacco use: Secondary | ICD-10-CM | POA: Diagnosis not present

## 2023-12-07 DIAGNOSIS — E559 Vitamin D deficiency, unspecified: Secondary | ICD-10-CM

## 2023-12-07 DIAGNOSIS — R7303 Prediabetes: Secondary | ICD-10-CM

## 2023-12-07 DIAGNOSIS — E782 Mixed hyperlipidemia: Secondary | ICD-10-CM

## 2023-12-07 NOTE — Progress Notes (Signed)
 Established Patient Office Visit  Subjective:  Patient ID: Kayla Carlson, female    DOB: Apr 01, 1971  Age: 53 y.o. MRN: [id number]  Chief Complaint  Patient presents with   Follow-up    1 week follow up    Patient returns for her follow-up today.  At her last visit she had gained a lot of weight due to fluid retention and heat.  She was started on p.o. Lasix twice a day and advised to hold her spironolactone .  Patient feels much better today her swelling has gone down significantly.  She lost 4 pounds of water weight.  Feels well and denies any leg cramps.   Patient now advised to resume her spironolactone , and can take her Lasix only 2 or 3 times per week as needed.  She is also encouraged to wear compression stockings.    No other concerns at this time.   Past Medical History:  Diagnosis Date   Asthma    GAD (generalized anxiety disorder)    GERD without esophagitis    Hypertension    Mixed hyperlipidemia     Past Surgical History:  Procedure Laterality Date   ABDOMINAL HYSTERECTOMY  2010   BREAST EXCISIONAL BIOPSY Left 2013   benign    Social History   Socioeconomic History   Marital status: Married    Spouse name: Not on file   Number of children: Not on file   Years of education: Not on file   Highest education level: Not on file  Occupational History   Not on file  Tobacco Use   Smoking status: Every Day   Smokeless tobacco: Never  Substance and Sexual Activity   Alcohol use: Never   Drug use: Never   Sexual activity: Yes  Other Topics Concern   Not on file  Social History Narrative   Not on file   Social Drivers of Health   Financial Resource Strain: Low Risk  (08/10/2023)   Received from [facility]   Overall Financial Resource Strain (CARDIA)    Difficulty of Paying Living Expenses: Not hard at all  Food Insecurity: No Food Insecurity (08/10/2023)   Received from [facility]   Hunger Vital Sign    Within the  past 12 months, you worried that your food would run out before you got the money to buy more.: Never true    Within the past 12 months, the food you bought just didn't last and you didn't have money to get more.: Never true  Transportation Needs: No Transportation Needs (08/10/2023)   Received from [facility] - Transportation    In the past 12 months, has lack of transportation kept you from medical appointments or from getting medications?: No    Lack of Transportation (Non-Medical): No  Physical Activity: Not on file  Stress: Not on file  Social Connections: Not on file  Intimate Partner Violence: Not on file    Family History  Problem Relation Age of Onset   Breast cancer Mother 32    Allergies  Allergen Reactions   Codeine    Doxycycline Itching and Swelling    Tongue turned   Lisinopril     Outpatient Medications Prior to Visit  Medication Sig   albuterol  (VENTOLIN  HFA) 108 (90 Base) MCG/ACT inhaler INHALE 1 TO 2 PUFFS BY MOUTH EVERY 4 TO 6 HOURS AS NEEDED FOR SHORTNESS OF BREATH   amLODipine  (NORVASC ) 5 MG tablet TAKE 1 TABLET BY  MOUTH EVERY DAY   benazepril  (LOTENSIN ) 10 MG tablet Take 1 tablet (10 mg total) by mouth daily.   furosemide (LASIX) 20 MG tablet Take 1 tablet (20 mg total) by mouth daily.   simvastatin  (ZOCOR ) 10 MG tablet Take 1 tablet (10 mg total) by mouth daily.   Vitamin D , Ergocalciferol , (DRISDOL) 1.25 MG (50000 UNIT) CAPS capsule TAKE 1 CAPSULE BY MOUTH EVERY WEEK   mupirocin ointment (BACTROBAN) 2 % Apply 1 Application topically daily. (Patient not taking: Reported on 12/07/2023)   spironolactone  (ALDACTONE ) 50 MG tablet Take 1 tablet (50 mg total) by mouth daily. (Patient not taking: Reported on 12/07/2023)   [DISCONTINUED] nicotine  (NICODERM CQ  - DOSED IN MG/24 HOURS) 21 mg/24hr patch Place 1 patch (21 mg total) onto the skin daily. (Patient not taking: Reported on 12/07/2023)   No facility-administered medications prior to  visit.    Review of Systems  Constitutional: Negative.  Negative for diaphoresis, fever and malaise/fatigue.  HENT: Negative.  Negative for sore throat.   Eyes: Negative.   Respiratory: Negative.  Negative for cough and shortness of breath.   Cardiovascular: Negative.  Negative for chest pain, palpitations and leg swelling.  Gastrointestinal: Negative.  Negative for abdominal pain, constipation, diarrhea, heartburn, nausea and vomiting.  Genitourinary: Negative.  Negative for dysuria and flank pain.  Musculoskeletal: Negative.  Negative for joint pain and myalgias.  Skin: Negative.   Neurological: Negative.  Negative for dizziness, tingling, tremors and headaches.  Endo/Heme/Allergies: Negative.   Psychiatric/Behavioral: Negative.  Negative for depression and suicidal ideas. The patient is not nervous/anxious.        Objective:   BP 120/82   Pulse 96   Ht 5' 2 (1.575 m)   Wt 291 lb (132 kg)   SpO2 95%   BMI 53.22 kg/m   Vitals:   12/07/23 1019  BP: 120/82  Pulse: 96  Height: 5' 2 (1.575 m)  Weight: 291 lb (132 kg)  SpO2: 95%  BMI (Calculated): 53.21    Physical Exam Vitals and nursing note reviewed.  Constitutional:      Appearance: Normal appearance.  HENT:     Head: Normocephalic and atraumatic.     Nose: Nose normal.     Mouth/Throat:     Mouth: Mucous membranes are moist.     Pharynx: Oropharynx is clear.  Eyes:     Conjunctiva/sclera: Conjunctivae normal.     Pupils: Pupils are equal, round, and reactive to light.  Cardiovascular:     Rate and Rhythm: Normal rate and regular rhythm.     Pulses: Normal pulses.     Heart sounds: Normal heart sounds. No murmur heard. Pulmonary:     Effort: Pulmonary effort is normal.     Breath sounds: Normal breath sounds. No wheezing.  Abdominal:     General: Bowel sounds are normal.     Palpations: Abdomen is soft.     Tenderness: There is no abdominal tenderness. There is no right CVA tenderness or left CVA  tenderness.  Musculoskeletal:        General: Normal range of motion.     Cervical back: Normal range of motion.     Right lower leg: No edema.     Left lower leg: No edema.  Skin:    General: Skin is warm and dry.  Neurological:     General: No focal deficit present.     Mental Status: She is alert and oriented to person, place, and time.  Psychiatric:  Mood and Affect: Mood normal.        Behavior: Behavior normal.      No results found for any visits on 12/07/23.  Recent Results (from the past 2160 hours)  CMP14+EGFR     Status: Abnormal   Collection Time: 12/01/23 10:43 AM  Result Value Ref Range   Glucose 109 (H) 70 - 99 mg/dL   BUN 11 6 - 24 mg/dL   Creatinine, Ser 9.06 0.57 - 1.00 mg/dL   eGFR 73 >40 fO/fpw/8.26   BUN/Creatinine Ratio 12 9 - 23   Sodium 141 134 - 144 mmol/L   Potassium 4.4 3.5 - 5.2 mmol/L   Chloride 100 96 - 106 mmol/L   CO2 23 20 - 29 mmol/L   Calcium 11.6 (H) 8.7 - 10.2 mg/dL   Total Protein 7.2 6.0 - 8.5 g/dL   Albumin 4.5 3.8 - 4.9 g/dL   Globulin, Total 2.7 1.5 - 4.5 g/dL   Bilirubin Total 0.2 0.0 - 1.2 mg/dL   Alkaline Phosphatase 57 44 - 121 IU/L   AST 21 0 - 40 IU/L   ALT 25 0 - 32 IU/L  Lipid Panel w/o Chol/HDL Ratio     Status: None   Collection Time: 12/01/23 10:43 AM  Result Value Ref Range   Cholesterol, Total 151 100 - 199 mg/dL   Triglycerides 864 0 - 149 mg/dL   HDL 43 >60 mg/dL   VLDL Cholesterol Cal 24 5 - 40 mg/dL   LDL Chol Calc (NIH) 84 0 - 99 mg/dL  Vitamin D  (25 hydroxy)     Status: Abnormal   Collection Time: 12/01/23 10:43 AM  Result Value Ref Range   Vit D, 25-Hydroxy 29.0 (L) 30.0 - 100.0 ng/mL    Comment: Vitamin D  deficiency has been defined by the Institute of Medicine and an Endocrine Society practice guideline as a level of serum 25-OH vitamin D  less than 20 ng/mL (1,2). The Endocrine Society went on to further define vitamin D  insufficiency as a level between 21 and 29 ng/mL (2). 1. IOM  (Institute of Medicine). 2010. Dietary reference    intakes for calcium and D. [place]: The    [employer]. 2. Holick MF, Binkley Espy, Bischoff-Ferrari HA, et al.    Evaluation, treatment, and prevention of vitamin D     deficiency: an Endocrine Society clinical practice    guideline. JCEM. 2011 Jul; 96(7):1911-30.   Hemoglobin A1c     Status: Abnormal   Collection Time: 12/01/23 10:43 AM  Result Value Ref Range   Hgb A1c MFr Bld 5.7 (H) 4.8 - 5.6 %    Comment:          Prediabetes: 5.7 - 6.4          Diabetes: >6.4          Glycemic control for adults with diabetes: <7.0    Est. average glucose Bld gHb Est-mCnc 117 mg/dL      Assessment & Plan:  Patient advised to continue current medications.  Smoking cessation encouraged.  Will return in 4 months for follow-up. Problem List Items Addressed This Visit     Essential hypertension, benign - Primary   HLD (hyperlipidemia)   Obesity, morbid, BMI 50 or higher (HCC)   Vitamin D  deficiency   Other Visit Diagnoses       Nicotine  abuse           Follow up 4 months.   Total time spent: 30 minutes  [facility]  S, MD  12/07/2023   This document may have been prepared by [facility] Voice Recognition software and as such may include unintentional dictation errors.

## 2023-12-12 ENCOUNTER — Other Ambulatory Visit: Payer: Self-pay | Admitting: Internal Medicine

## 2023-12-12 DIAGNOSIS — J4520 Mild intermittent asthma, uncomplicated: Secondary | ICD-10-CM

## 2023-12-12 DIAGNOSIS — I10 Essential (primary) hypertension: Secondary | ICD-10-CM

## 2023-12-15 ENCOUNTER — Ambulatory Visit
Admission: RE | Admit: 2023-12-15 | Discharge: 2023-12-15 | Disposition: A | Discharge status: Home / Self Care | Source: Ambulatory Visit | Attending: Internal Medicine | Admitting: Internal Medicine

## 2023-12-15 ENCOUNTER — Ambulatory Visit: Payer: Self-pay | Admitting: Cardiology

## 2023-12-15 DIAGNOSIS — R928 Other abnormal and inconclusive findings on diagnostic imaging of breast: Secondary | ICD-10-CM

## 2024-01-24 ENCOUNTER — Other Ambulatory Visit: Payer: Self-pay | Admitting: Internal Medicine

## 2024-01-24 DIAGNOSIS — J4520 Mild intermittent asthma, uncomplicated: Secondary | ICD-10-CM

## 2024-01-25 ENCOUNTER — Other Ambulatory Visit: Payer: Self-pay

## 2024-01-25 DIAGNOSIS — I10 Essential (primary) hypertension: Secondary | ICD-10-CM

## 2024-01-25 MED ORDER — BENAZEPRIL HCL 10 MG PO TABS: 10.0000 mg | ORAL_TABLET | Freq: Every day | ORAL | 3 refills | Status: DC

## 2024-01-29 ENCOUNTER — Other Ambulatory Visit: Payer: Self-pay | Admitting: Internal Medicine

## 2024-01-29 DIAGNOSIS — I10 Essential (primary) hypertension: Secondary | ICD-10-CM

## 2024-01-30 ENCOUNTER — Other Ambulatory Visit: Payer: Self-pay | Admitting: Internal Medicine

## 2024-01-30 DIAGNOSIS — I10 Essential (primary) hypertension: Secondary | ICD-10-CM

## 2024-02-01 ENCOUNTER — Other Ambulatory Visit: Payer: Self-pay | Admitting: Internal Medicine

## 2024-02-01 ENCOUNTER — Other Ambulatory Visit: Payer: Self-pay

## 2024-02-01 DIAGNOSIS — I10 Essential (primary) hypertension: Secondary | ICD-10-CM

## 2024-02-07 ENCOUNTER — Other Ambulatory Visit: Payer: Self-pay | Admitting: Cardiology

## 2024-03-12 ENCOUNTER — Other Ambulatory Visit: Payer: Self-pay | Admitting: Internal Medicine

## 2024-03-12 DIAGNOSIS — J4520 Mild intermittent asthma, uncomplicated: Secondary | ICD-10-CM

## 2024-03-25 ENCOUNTER — Other Ambulatory Visit: Payer: Self-pay | Admitting: Internal Medicine

## 2024-03-25 DIAGNOSIS — E782 Mixed hyperlipidemia: Secondary | ICD-10-CM

## 2024-03-29 ENCOUNTER — Ambulatory Visit: Admitting: Internal Medicine

## 2024-04-05 ENCOUNTER — Ambulatory Visit: Admitting: Internal Medicine

## 2024-04-29 ENCOUNTER — Other Ambulatory Visit: Payer: Self-pay | Admitting: Internal Medicine

## 2024-04-29 DIAGNOSIS — J4520 Mild intermittent asthma, uncomplicated: Secondary | ICD-10-CM

## 2024-05-08 ENCOUNTER — Other Ambulatory Visit: Payer: Self-pay | Admitting: Internal Medicine

## 2024-05-12 ENCOUNTER — Observation Stay
Admission: EM | Admit: 2024-05-12 | Discharge: 2024-05-15 | DRG: 190 | Disposition: A | Attending: Hospitalist | Admitting: Hospitalist

## 2024-05-12 ENCOUNTER — Other Ambulatory Visit: Payer: Self-pay

## 2024-05-12 ENCOUNTER — Emergency Department

## 2024-05-12 DIAGNOSIS — J9601 Acute respiratory failure with hypoxia: Secondary | ICD-10-CM | POA: Diagnosis not present

## 2024-05-12 DIAGNOSIS — F17200 Nicotine dependence, unspecified, uncomplicated: Secondary | ICD-10-CM

## 2024-05-12 DIAGNOSIS — E785 Hyperlipidemia, unspecified: Secondary | ICD-10-CM | POA: Diagnosis present

## 2024-05-12 DIAGNOSIS — E782 Mixed hyperlipidemia: Secondary | ICD-10-CM

## 2024-05-12 DIAGNOSIS — I10 Essential (primary) hypertension: Secondary | ICD-10-CM | POA: Diagnosis present

## 2024-05-12 DIAGNOSIS — E872 Acidosis, unspecified: Secondary | ICD-10-CM | POA: Diagnosis not present

## 2024-05-12 DIAGNOSIS — K219 Gastro-esophageal reflux disease without esophagitis: Secondary | ICD-10-CM | POA: Diagnosis present

## 2024-05-12 DIAGNOSIS — F17209 Nicotine dependence, unspecified, with unspecified nicotine-induced disorders: Secondary | ICD-10-CM | POA: Diagnosis present

## 2024-05-12 DIAGNOSIS — J4521 Mild intermittent asthma with (acute) exacerbation: Secondary | ICD-10-CM

## 2024-05-12 DIAGNOSIS — Z6841 Body Mass Index (BMI) 40.0 and over, adult: Secondary | ICD-10-CM

## 2024-05-12 DIAGNOSIS — J45901 Unspecified asthma with (acute) exacerbation: Principal | ICD-10-CM | POA: Diagnosis present

## 2024-05-12 LAB — CBC WITH DIFFERENTIAL/PLATELET
Abs Immature Granulocytes: 0.02 K/uL (ref 0.00–0.07)
Basophils Absolute: 0.1 K/uL (ref 0.0–0.1)
Basophils Relative: 1 %
Eosinophils Absolute: 0 K/uL (ref 0.0–0.5)
Eosinophils Relative: 1 %
HCT: 46.6 % — ABNORMAL HIGH (ref 36.0–46.0)
Hemoglobin: 15.4 g/dL — ABNORMAL HIGH (ref 12.0–15.0)
Immature Granulocytes: 0 %
Lymphocytes Relative: 29 %
Lymphs Abs: 1.6 K/uL (ref 0.7–4.0)
MCH: 31.7 pg (ref 26.0–34.0)
MCHC: 33 g/dL (ref 30.0–36.0)
MCV: 95.9 fL (ref 80.0–100.0)
Monocytes Absolute: 0.6 K/uL (ref 0.1–1.0)
Monocytes Relative: 11 %
Neutro Abs: 3.3 K/uL (ref 1.7–7.7)
Neutrophils Relative %: 58 %
Platelets: 202 K/uL (ref 150–400)
RBC: 4.86 MIL/uL (ref 3.87–5.11)
RDW: 13.1 % (ref 11.5–15.5)
WBC: 5.6 K/uL (ref 4.0–10.5)
nRBC: 0 % (ref 0.0–0.2)

## 2024-05-12 LAB — PRO BRAIN NATRIURETIC PEPTIDE: Pro Brain Natriuretic Peptide: <50 pg/mL (ref <300.0)

## 2024-05-12 LAB — RESP PANEL BY RT-PCR (RSV, FLU A&B, COVID)  RVPGX2
Influenza A by PCR: NEGATIVE
Influenza B by PCR: NEGATIVE
Resp Syncytial Virus by PCR: NEGATIVE
SARS Coronavirus 2 by RT PCR: NEGATIVE

## 2024-05-12 LAB — LACTIC ACID, PLASMA
Lactic Acid, Venous: 2.7 mmol/L (ref 0.5–1.9)
Lactic Acid, Venous: 4.5 mmol/L (ref 0.5–1.9)

## 2024-05-12 LAB — COMPREHENSIVE METABOLIC PANEL WITH GFR
ALT: 28 U/L (ref 0–44)
AST: 36 U/L (ref 15–41)
Albumin: 4.5 g/dL (ref 3.5–5.0)
Alkaline Phosphatase: 64 U/L (ref 38–126)
Anion gap: 15 (ref 5–15)
BUN: 6 mg/dL (ref 6–20)
CO2: 25 mmol/L (ref 22–32)
Calcium: 10.6 mg/dL — ABNORMAL HIGH (ref 8.9–10.3)
Chloride: 97 mmol/L — ABNORMAL LOW (ref 98–111)
Creatinine, Ser: 0.76 mg/dL (ref 0.44–1.00)
GFR, Estimated: >60 mL/min
Glucose, Bld: 130 mg/dL — ABNORMAL HIGH (ref 70–99)
Potassium: 3.5 mmol/L (ref 3.5–5.1)
Sodium: 138 mmol/L (ref 135–145)
Total Bilirubin: 0.4 mg/dL (ref 0.0–1.2)
Total Protein: 7.7 g/dL (ref 6.5–8.1)

## 2024-05-12 LAB — D-DIMER, QUANTITATIVE: D-Dimer, Quant: 0.55 ug{FEU}/mL — ABNORMAL HIGH (ref 0.00–0.50)

## 2024-05-12 LAB — TROPONIN T, HIGH SENSITIVITY
Troponin T High Sensitivity: <15 ng/L (ref 0–19)
Troponin T High Sensitivity: <15 ng/L (ref 0–19)

## 2024-05-12 MED ORDER — ENOXAPARIN SODIUM 80 MG/0.8ML IJ SOSY
0.5000 mg/kg | PREFILLED_SYRINGE | INTRAMUSCULAR | Status: DC
  Administered 2024-05-13 – 2024-05-15 (×3): 65 mg via SUBCUTANEOUS
  Filled 2024-05-12 (×4): qty 0.65

## 2024-05-12 MED ORDER — ONDANSETRON HCL 4 MG PO TABS: 4.0000 mg | ORAL_TABLET | Freq: Four times a day (QID) | ORAL | Status: DC | PRN

## 2024-05-12 MED ORDER — ACETAMINOPHEN 650 MG RE SUPP: 650.0000 mg | Freq: Four times a day (QID) | RECTAL | Status: DC | PRN

## 2024-05-12 MED ORDER — IPRATROPIUM-ALBUTEROL 0.5-2.5 (3) MG/3ML IN SOLN
6.0000 mL | Freq: Once | RESPIRATORY_TRACT | Status: AC
  Administered 2024-05-12: 6 mL via RESPIRATORY_TRACT
  Filled 2024-05-12: qty 6

## 2024-05-12 MED ORDER — IOHEXOL 350 MG/ML SOLN
75.0000 mL | Freq: Once | INTRAVENOUS | Status: AC | PRN
  Administered 2024-05-12: 75 mL via INTRAVENOUS

## 2024-05-12 MED ORDER — ALBUTEROL SULFATE (2.5 MG/3ML) 0.083% IN NEBU
5.0000 mg | INHALATION_SOLUTION | Freq: Once | RESPIRATORY_TRACT | Status: AC
  Administered 2024-05-12: 5 mg via RESPIRATORY_TRACT
  Filled 2024-05-12: qty 6

## 2024-05-12 MED ORDER — ONDANSETRON HCL 4 MG/2ML IJ SOLN: 4.0000 mg | Freq: Four times a day (QID) | INTRAMUSCULAR | Status: DC | PRN

## 2024-05-12 MED ORDER — ACETAMINOPHEN 325 MG PO TABS: 650.0000 mg | ORAL_TABLET | Freq: Four times a day (QID) | ORAL | Status: DC | PRN

## 2024-05-12 MED ORDER — SODIUM CHLORIDE 0.9 % IV BOLUS
1000.0000 mL | Freq: Once | INTRAVENOUS | Status: AC
  Administered 2024-05-12: 1000 mL via INTRAVENOUS

## 2024-05-12 MED ORDER — IPRATROPIUM-ALBUTEROL 0.5-2.5 (3) MG/3ML IN SOLN: 6.0000 mL | Freq: Once | RESPIRATORY_TRACT | Status: DC

## 2024-05-12 NOTE — ED Notes (Signed)
 Pt moved to room 32. Pt assisted to the restroom and then to the bed. Changed into home nightgown. Pt provided with blanket per request. On CCM and pulse ox. Pt currently on 3L [place]. Considerable dyspnea on exertion noted.  Denies any additional needs at this time, call bell within reach, spouse at bedside

## 2024-05-12 NOTE — Progress Notes (Signed)
 Anticoagulation monitoring(Lovenox ):  53 yo  female ordered Lovenox  40 mg Q24h    Filed Weights   05/12/24 1534  Weight: 131.5 kg (290 lb)   BMI 53   Lab Results  Component Value Date   CREATININE 0.76 05/12/2024   CREATININE 0.93 12/01/2023   CREATININE 0.85 05/26/2023   Estimated Creatinine Clearance: 106.2 mL/min (by C-G formula based on SCr of 0.76 mg/dL). Hemoglobin & Hematocrit     Component Value Date/Time   HGB 15.4 (H) 05/12/2024 1536   HGB 15.6 05/26/2023 1122   HCT 46.6 (H) 05/12/2024 1536   HCT 47.8 (H) 05/26/2023 1122     Per Protocol for Patient with estCrcl > 30 ml/min and BMI > 30, will transition to Lovenox  65 mg Q24h.

## 2024-05-12 NOTE — ED Provider Notes (Addendum)
 [facility] Provider Note    Event Date/Time   First MD Initiated Contact with Patient 05/12/24 1531     (approximate)   History   Chief Complaint: Shortness of Breath   HPI  Kayla Carlson is a 53 y.o. female with history of hypertension, asthma, anxiety, GERD who comes ED complaining of shortness of breath since this morning.  She was in her usual state of health till last night when she started having a nonproductive cough.  Denies fever or chills or chest pain.  This morning she had worsening shortness of breath, unable to do simple things without getting too out of breath, including showering or walking short distances.  Tried using her inhalers without relief.  Called EMS who found her to be 86% on room air.  Gave Solu-Medrol , magnesium bolus, 3 nebs prior to arrival in the ED.  Patient reports minimal improvement of her symptoms.  She does note that her husband was sick with bronchitis a week ago.  Yesterday she was at a [place] in [place].        Past Medical History:  Diagnosis Date   Asthma    GAD (generalized anxiety disorder)    GERD without esophagitis    Hypertension    Mixed hyperlipidemia     Current Outpatient Rx   Order #: [id number] Class: Normal   Order #: [id number] Class: Normal   Order #: [id number] Class: Normal   Order #: [id number] Class: Normal   Order #: [id number] Class: Historical Med   Order #: [id number] Class: Normal   Order #: [id number] Class: Normal   Order #: [id number] Class: Normal    Past Surgical History:  Procedure Laterality Date   ABDOMINAL HYSTERECTOMY  2010   BREAST EXCISIONAL BIOPSY Left 2013   benign    Physical Exam   Triage Vital Signs: ED Triage Vitals  Encounter Vitals Group     BP      Girls Systolic BP Percentile      Girls Diastolic BP Percentile      Boys Systolic BP Percentile      Boys Diastolic BP Percentile      Pulse      Resp      Temp      Temp src      SpO2      Weight       Height      Head Circumference      Peak Flow      Pain Score      Pain Loc      Pain Education      Exclude from Growth Chart     Most recent vital signs: Vitals:   05/12/24 1900 05/12/24 1953  BP: (!) 143/75   Pulse: 99   Resp: 20   Temp:  99.4 F (37.4 C)  SpO2: 90%     General: Awake, no distress.  CV:  Good peripheral perfusion.  Tachycardia heart rate 115.  Normal distal pulses. Resp:  Tachypnea, increased work of breathing.  Diffuse expiratory wheezing and prolonged expiratory phase. Abd:  No distention.  Soft nontender Other:  Mild chronic lymphedema, no significant pitting edema   ED Results / Procedures / Treatments   Labs (all labs ordered are listed, but only abnormal results are displayed) Labs Reviewed  COMPREHENSIVE METABOLIC PANEL WITH GFR - Abnormal; Notable for the following components:      Result Value   Chloride 97 (*)    Glucose, Bld 130 (*)    Calcium  10.6 (*)    All other components within normal limits  CBC WITH DIFFERENTIAL/PLATELET - Abnormal; Notable for the following components:   Hemoglobin 15.4 (*)    HCT 46.6 (*)    All other components within normal limits  D-DIMER, QUANTITATIVE - Abnormal; Notable for the following components:   D-Dimer, Quant 0.55 (*)    All other components within normal limits  LACTIC ACID, PLASMA - Abnormal; Notable for the following components:   Lactic Acid, Venous 2.7 (*)    All other components within normal limits  LACTIC ACID, PLASMA - Abnormal; Notable for the following components:   Lactic Acid, Venous 4.5 (*)    All other components within normal limits  RESP PANEL BY RT-PCR (RSV, FLU A&B, COVID)  [id number]  PRO BRAIN NATRIURETIC PEPTIDE  TROPONIN T, HIGH SENSITIVITY  TROPONIN T, HIGH SENSITIVITY     EKG Interpreted by me Sinus tachycardia rate 106.  Left axis, normal intervals.  Poor R wave progression.  No acute ischemic changes.   RADIOLOGY Chest x-ray interpreted by me, unremarkable.   Radiology report reviewed  CT angiogram chest negative for PE or pneumonia   PROCEDURES:  Procedures   MEDICATIONS ORDERED IN ED: Medications  sodium chloride  0.9 % bolus 1,000 mL (0 mLs Intravenous Stopped 05/12/24 1953)  albuterol  (PROVENTIL ) (2.5 MG/3ML) 0.083% nebulizer solution 5 mg (5 mg Nebulization Given 05/12/24 1546)  iohexol  (OMNIPAQUE ) 350 MG/ML injection 75 mL (75 mLs Intravenous Contrast Given 05/12/24 1729)  ipratropium-albuterol  (DUONEB) 0.5-2.5 (3) MG/3ML nebulizer solution 6 mL (6 mLs Nebulization Given 05/12/24 2048)     IMPRESSION / MDM / ASSESSMENT AND PLAN / ED COURSE  I reviewed the triage vital signs and the nursing notes.  DDx: Asthma exacerbation, pneumonia, pulmonary embolism, pleural effusion, pulmonary edema, NSTEMI, electrolyte derangement  Patient's presentation is most consistent with acute presentation with potential threat to life or bodily function.  Patient presents with shortness of breath, most likely asthma exacerbation with persistent hypoxia despite multimodal therapy by EMS prior to arrival in the ED.  Will give additional albuterol  neb, obtain labs and x-ray.  Doubt sepsis with current presentation   Clinical Course as of 05/12/24 2145  [facility] May 12, 2024  1932 Pt feels better. HR improved. Still has diffuse wheezing. Recommended admission, pt declines and wishes to discharge home. States she feels close to normal.  Oxygen saturation 90% at rest in bed.  Will do ambulatory SO2 test. No apparent infectious source, doubt bacteremia/endocarditis. Uptrended lactate is likely erroneous [PS]  2050 Case d/w hospitalist [PS]    Clinical Course User Index [PS] Viviann Pastor, MD    ----------------------------------------- 8:29 PM on 05/12/2024 ----------------------------------------- With ambulation oxygen saturation dropped to 85%, and patient became severely symptomatic very quickly.  She is agreeable to hospitalization.  Will give  additional bronchodilators.   FINAL CLINICAL IMPRESSION(S) / ED DIAGNOSES   Final diagnoses:  Exacerbation of asthma, unspecified asthma severity, unspecified whether persistent  Acute respiratory failure with hypoxia (HCC)     Rx / DC Orders   ED Discharge Orders     None        Note:  This document was prepared using Dragon voice recognition software and may include unintentional dictation errors.   Viviann Pastor, MD 05/12/24 2029    Viviann Pastor, MD 05/12/24 [phone]

## 2024-05-12 NOTE — ED Triage Notes (Signed)
 Pt arrives via [facility] from home for SOB since last night. Pt has increased SOB in the last 3-4 hours. Pt tried to use her rescue inhalers but one was out and th eother was not working. Pt was found at 86% on RA. Pt was put on a nonrebreather. EMS gave 1 albuterol , 2 duonebs, 2 of magnesium sulfate, and 125 mg of solumedrol.   EMS vitals: 134/70 Bp 110 HR 99% SPO2 on 6L

## 2024-05-12 NOTE — H&P (Incomplete)
  History and Physical    Patient: Kayla Carlson FMW:[id number] DOB: 02-14-1971 DOA: 05/12/2024 DOS: the patient was seen and examined on 05/12/2024 PCP: Fernand Fredy RAMAN, MD  Patient coming from: {Point_of_Origin:[address]}  Chief Complaint:  Chief Complaint  Patient presents with   Shortness of Breath   HPI: Kayla Carlson is a 53 y.o. female with medical history significant of ***  Review of Systems: {ROS_Text:26778} Past Medical History:  Diagnosis Date   Asthma    GAD (generalized anxiety disorder)    GERD without esophagitis    Hypertension    Mixed hyperlipidemia    Past Surgical History:  Procedure Laterality Date   ABDOMINAL HYSTERECTOMY  2010   BREAST EXCISIONAL BIOPSY Left 2013   benign   Social History:  reports that she has been smoking cigarettes. She has never used smokeless tobacco. She reports that she does not drink alcohol and does not use drugs.  Allergies  Allergen Reactions   Codeine    Doxycycline Itching and Swelling    Tongue turned   Lisinopril     Family History  Problem Relation Age of Onset   Breast cancer Mother 67    Prior to Admission medications   Medication Sig Start Date End Date Taking? Authorizing Provider  albuterol  (VENTOLIN  HFA) 108 (90 Base) MCG/ACT inhaler INHALE 1 TO 2 PUFFS BY MOUTH EVERY 4 TO 6 HOURS AS NEEDED FOR SHORTNESS OF BREATH 04/29/24   Fernand Fredy RAMAN, MD  amLODipine  (NORVASC ) 5 MG tablet TAKE 1 TABLET BY MOUTH EVERY DAY 12/12/23   Fernand Fredy RAMAN, MD  benazepril  (LOTENSIN ) 10 MG tablet TAKE 1 TABLET(10 MG) BY MOUTH DAILY 02/01/24   Fernand Fredy RAMAN, MD  furosemide  (LASIX ) 20 MG tablet Take 1 tablet (20 mg total) by mouth daily. 12/01/23 11/30/24  Fernand Fredy RAMAN, MD  mupirocin ointment (BACTROBAN) 2 % Apply 1 Application topically daily. Patient not taking: Reported on 12/07/2023 05/09/23   [provider]  simvastatin  (ZOCOR ) 10 MG tablet TAKE 1 TABLET(10 MG) BY MOUTH DAILY 03/25/24   Fernand Fredy RAMAN, MD   spironolactone  (ALDACTONE ) 50 MG tablet TAKE 1 TABLET(50 MG) BY MOUTH DAILY 05/08/24   Fernand Fredy RAMAN, MD  Vitamin D , Ergocalciferol , (DRISDOL) 1.25 MG (50000 UNIT) CAPS capsule TAKE 1 CAPSULE BY MOUTH EVERY WEEK 07/06/23   Fernand Fredy RAMAN, MD    Physical Exam: Vitals:   05/12/24 1800 05/12/24 1830 05/12/24 1900 05/12/24 1953  BP: 128/73 133/79 (!) 143/75   Pulse: (!) 107 (!) 104 99   Resp: 15 17 20    Temp:    99.4 F (37.4 C)  TempSrc:    Oral  SpO2: 96% 94% 90%   Weight:      Height:       *** Data Reviewed: {Tip this will not be part of the note when signed- Document your independent interpretation of telemetry tracing, EKG, lab, Radiology test or any other diagnostic tests. Add any new diagnostic test ordered today. (Optional):26781} {Results:26384}  Assessment and Plan: No notes have been filed under this hospital service. Service: Hospitalist     Advance Care Planning:   Code Status: Prior ***  Consults: ***  Family Communication: ***  Severity of Illness: {Observation/Inpatient:21159}  Author: Posey Maier, DO 05/12/2024 8:51 PM  For on call review [email or web address].

## 2024-05-13 ENCOUNTER — Encounter: Payer: Self-pay | Admitting: Internal Medicine

## 2024-05-13 ENCOUNTER — Other Ambulatory Visit: Payer: Self-pay

## 2024-05-13 DIAGNOSIS — Z888 Allergy status to other drugs, medicaments and biological substances status: Secondary | ICD-10-CM | POA: Diagnosis not present

## 2024-05-13 DIAGNOSIS — J441 Chronic obstructive pulmonary disease with (acute) exacerbation: Secondary | ICD-10-CM | POA: Diagnosis present

## 2024-05-13 DIAGNOSIS — E782 Mixed hyperlipidemia: Secondary | ICD-10-CM | POA: Diagnosis present

## 2024-05-13 DIAGNOSIS — Z1152 Encounter for screening for COVID-19: Secondary | ICD-10-CM | POA: Diagnosis not present

## 2024-05-13 DIAGNOSIS — Z881 Allergy status to other antibiotic agents status: Secondary | ICD-10-CM | POA: Diagnosis not present

## 2024-05-13 DIAGNOSIS — F17210 Nicotine dependence, cigarettes, uncomplicated: Secondary | ICD-10-CM | POA: Diagnosis present

## 2024-05-13 DIAGNOSIS — F419 Anxiety disorder, unspecified: Secondary | ICD-10-CM | POA: Diagnosis present

## 2024-05-13 DIAGNOSIS — I10 Essential (primary) hypertension: Secondary | ICD-10-CM | POA: Diagnosis present

## 2024-05-13 DIAGNOSIS — J9601 Acute respiratory failure with hypoxia: Secondary | ICD-10-CM | POA: Diagnosis present

## 2024-05-13 DIAGNOSIS — F17209 Nicotine dependence, unspecified, with unspecified nicotine-induced disorders: Secondary | ICD-10-CM | POA: Diagnosis not present

## 2024-05-13 DIAGNOSIS — K219 Gastro-esophageal reflux disease without esophagitis: Secondary | ICD-10-CM | POA: Diagnosis present

## 2024-05-13 DIAGNOSIS — I2699 Other pulmonary embolism without acute cor pulmonale: Secondary | ICD-10-CM | POA: Diagnosis present

## 2024-05-13 DIAGNOSIS — J45901 Unspecified asthma with (acute) exacerbation: Secondary | ICD-10-CM | POA: Diagnosis present

## 2024-05-13 DIAGNOSIS — R000 Tachycardia, unspecified: Secondary | ICD-10-CM | POA: Diagnosis present

## 2024-05-13 DIAGNOSIS — F411 Generalized anxiety disorder: Secondary | ICD-10-CM | POA: Diagnosis present

## 2024-05-13 DIAGNOSIS — Z6841 Body Mass Index (BMI) 40.0 and over, adult: Secondary | ICD-10-CM | POA: Diagnosis not present

## 2024-05-13 DIAGNOSIS — Z79899 Other long term (current) drug therapy: Secondary | ICD-10-CM | POA: Diagnosis not present

## 2024-05-13 DIAGNOSIS — E872 Acidosis, unspecified: Secondary | ICD-10-CM | POA: Diagnosis present

## 2024-05-13 DIAGNOSIS — Z90710 Acquired absence of both cervix and uterus: Secondary | ICD-10-CM | POA: Diagnosis not present

## 2024-05-13 DIAGNOSIS — Z885 Allergy status to narcotic agent status: Secondary | ICD-10-CM | POA: Diagnosis not present

## 2024-05-13 DIAGNOSIS — R0602 Shortness of breath: Secondary | ICD-10-CM | POA: Diagnosis present

## 2024-05-13 LAB — COMPREHENSIVE METABOLIC PANEL WITH GFR
ALT: 25 U/L (ref 0–44)
AST: 42 U/L — ABNORMAL HIGH (ref 15–41)
Albumin: 4.2 g/dL (ref 3.5–5.0)
Alkaline Phosphatase: 56 U/L (ref 38–126)
Anion gap: 9 (ref 5–15)
BUN: 8 mg/dL (ref 6–20)
CO2: 27 mmol/L (ref 22–32)
Calcium: 10.4 mg/dL — ABNORMAL HIGH (ref 8.9–10.3)
Chloride: 102 mmol/L (ref 98–111)
Creatinine, Ser: 0.73 mg/dL (ref 0.44–1.00)
GFR, Estimated: >60 mL/min (ref >60)
Glucose, Bld: 135 mg/dL — ABNORMAL HIGH (ref 70–99)
Potassium: 4.4 mmol/L (ref 3.5–5.1)
Sodium: 138 mmol/L (ref 135–145)
Total Bilirubin: 0.3 mg/dL (ref 0.0–1.2)
Total Protein: 7 g/dL (ref 6.5–8.1)

## 2024-05-13 LAB — CBC
HCT: 42.4 % (ref 36.0–46.0)
HCT: 43.8 % (ref 36.0–46.0)
Hemoglobin: 14.1 g/dL (ref 12.0–15.0)
Hemoglobin: 14.5 g/dL (ref 12.0–15.0)
MCH: 31.7 pg (ref 26.0–34.0)
MCH: 31.8 pg (ref 26.0–34.0)
MCHC: 33.1 g/dL (ref 30.0–36.0)
MCHC: 33.3 g/dL (ref 30.0–36.0)
MCV: 95.7 fL (ref 80.0–100.0)
MCV: 95.8 fL (ref 80.0–100.0)
Platelets: 208 K/uL (ref 150–400)
Platelets: 213 K/uL (ref 150–400)
RBC: 4.43 MIL/uL (ref 3.87–5.11)
RBC: 4.57 MIL/uL (ref 3.87–5.11)
RDW: 13 % (ref 11.5–15.5)
RDW: 13.1 % (ref 11.5–15.5)
WBC: 6.3 K/uL (ref 4.0–10.5)
WBC: 7.4 K/uL (ref 4.0–10.5)
nRBC: 0 % (ref 0.0–0.2)
nRBC: 0 % (ref 0.0–0.2)

## 2024-05-13 LAB — CREATININE, SERUM
Creatinine, Ser: 0.79 mg/dL (ref 0.44–1.00)
GFR, Estimated: >60 mL/min (ref >60)

## 2024-05-13 LAB — PHOSPHORUS: Phosphorus: 3.1 mg/dL (ref 2.5–4.6)

## 2024-05-13 LAB — LACTIC ACID, PLASMA
Lactic Acid, Venous: 1.5 mmol/L (ref 0.5–1.9)
Lactic Acid, Venous: 0.9 mmol/L (ref 0.5–1.9)

## 2024-05-13 LAB — HIV ANTIBODY (ROUTINE TESTING W REFLEX): HIV Screen 4th Generation wRfx: NONREACTIVE

## 2024-05-13 LAB — MAGNESIUM: Magnesium: 2.4 mg/dL (ref 1.7–2.4)

## 2024-05-13 MED ORDER — LEVALBUTEROL HCL 0.63 MG/3ML IN NEBU
0.6300 mg | INHALATION_SOLUTION | Freq: Four times a day (QID) | RESPIRATORY_TRACT | Status: DC
  Administered 2024-05-13 – 2024-05-14 (×7): 0.63 mg via RESPIRATORY_TRACT
  Filled 2024-05-13 (×9): qty 3

## 2024-05-13 MED ORDER — DM-GUAIFENESIN ER 30-600 MG PO TB12
1.0000 | ORAL_TABLET | Freq: Two times a day (BID) | ORAL | Status: DC
  Administered 2024-05-13 – 2024-05-15 (×5): 1 via ORAL
  Filled 2024-05-13 (×6): qty 1

## 2024-05-13 MED ORDER — PANTOPRAZOLE SODIUM 40 MG PO TBEC
40.0000 mg | DELAYED_RELEASE_TABLET | Freq: Every day | ORAL | Status: DC
  Administered 2024-05-14 – 2024-05-15 (×2): 40 mg via ORAL
  Filled 2024-05-13 (×3): qty 1

## 2024-05-13 MED ORDER — IPRATROPIUM BROMIDE 0.02 % IN SOLN
0.5000 mg | Freq: Four times a day (QID) | RESPIRATORY_TRACT | Status: DC
  Administered 2024-05-13 – 2024-05-14 (×7): 0.5 mg via RESPIRATORY_TRACT
  Filled 2024-05-13 (×9): qty 2.5

## 2024-05-13 MED ORDER — AZITHROMYCIN 500 MG PO TABS: 250.0000 mg | ORAL_TABLET | Freq: Every day | ORAL | Status: DC

## 2024-05-13 MED ORDER — AZITHROMYCIN 500 MG PO TABS: 500.0000 mg | ORAL_TABLET | Freq: Every day | ORAL | Status: DC

## 2024-05-13 MED ORDER — SIMVASTATIN 20 MG PO TABS
10.0000 mg | ORAL_TABLET | Freq: Every day | ORAL | Status: DC
  Administered 2024-05-13 – 2024-05-14 (×2): 10 mg via ORAL
  Filled 2024-05-13 (×2): qty 1

## 2024-05-13 MED ORDER — AMLODIPINE BESYLATE 5 MG PO TABS
5.0000 mg | ORAL_TABLET | Freq: Every day | ORAL | Status: DC
  Administered 2024-05-13 – 2024-05-15 (×2): 5 mg via ORAL
  Filled 2024-05-13 (×3): qty 1

## 2024-05-13 MED ORDER — AZITHROMYCIN 500 MG PO TABS
250.0000 mg | ORAL_TABLET | Freq: Every day | ORAL | Status: DC
  Administered 2024-05-14 – 2024-05-15 (×2): 250 mg via ORAL
  Filled 2024-05-13 (×2): qty 1

## 2024-05-13 MED ORDER — AZITHROMYCIN 500 MG PO TABS
500.0000 mg | ORAL_TABLET | Freq: Every day | ORAL | Status: AC
  Administered 2024-05-13: 500 mg via ORAL
  Filled 2024-05-13: qty 1

## 2024-05-13 MED ORDER — BENAZEPRIL HCL 10 MG PO TABS
10.0000 mg | ORAL_TABLET | Freq: Every day | ORAL | Status: DC
  Administered 2024-05-13 – 2024-05-15 (×2): 10 mg via ORAL
  Filled 2024-05-13 (×3): qty 1

## 2024-05-13 MED ORDER — FAMOTIDINE 20 MG PO TABS: 20.0000 mg | ORAL_TABLET | Freq: Every evening | ORAL | Status: DC | PRN

## 2024-05-13 MED ORDER — METHYLPREDNISOLONE SODIUM SUCC 40 MG IJ SOLR
40.0000 mg | Freq: Two times a day (BID) | INTRAMUSCULAR | Status: DC
  Administered 2024-05-13: 40 mg via INTRAVENOUS
  Filled 2024-05-13: qty 1

## 2024-05-13 MED ORDER — PREDNISONE 20 MG PO TABS
40.0000 mg | ORAL_TABLET | Freq: Every day | ORAL | Status: DC
  Administered 2024-05-14 – 2024-05-15 (×2): 40 mg via ORAL
  Filled 2024-05-13 (×2): qty 2

## 2024-05-13 NOTE — ED Notes (Signed)
 Pt ambulated to the restroom and back to bed. Denies any additional needs at this time, NAD noted, call bell within reach

## 2024-05-13 NOTE — Progress Notes (Signed)
 PROGRESS NOTE    Kayla Carlson  FMW:[id number] DOB: May 02, 1971 DOA: 05/12/2024 PCP: Kayla Fredy RAMAN, MD   Brief Narrative:   Kayla Carlson is a 53 y.o. female with medical history significant of hypertension, asthma hyperlipidemia, tobacco use who presents to the emergency department which started last night but worsened this morning with difficulty in being able to do simple tasks such as taking a shower or walking short distances within the house without being short of breath.  She used home inhaler without relief, so she activated EMS who noted her O2 sat to be 86% on room air.  3 rounds of breathing treatment with nebulizer, Solu-Medrol  and magnesium were given and she was taken to the ED for further evaluation.   ED course  In the emergency department, she was tachycardic, BP was 155/86, O2 sat was 95% on supplemental oxygen via [place] at 2 LPM.  Respiratory panel was negative CT angiography of chest showed known large central pulmonary embolus. EKG showed sinus tachycardia at a rate of 106 bpm with QTc of 491 ms    Assessment & Plan:   Principal Problem:   Asthma exacerbation   Acute exacerbation of asthma Acute respiratory failure with hypoxia Continue Xopenex , Atrovent , Mucinex , Solu-Medrol , azithromycin . Will switch Solu-Medrol  to prednisone  in the a.m. wean down oxygen as tolerated.  Encourage ambulation. Continue Protonix  to prevent steroid-induced ulcer Continue incentive spirometry and flutter valve Continue supplemental oxygen to maintain O2 sat > 92% with plan to wean patient off oxygen as tolerated.  (Patient does not use oxygen at baseline)   Lactic acidosis possibly due to hypoxia Lactic acid 2.7 > 4.5; continue supplemental oxygen and continue to trend lactic acid   Morbid obesity (BMI of 53.04) Diet and lifestyle modification Patient may need to follow-up with PCP for weight loss program   Essential hypertension Continue amlodipine , benazepril    Mixed  hyperlipidemia Continue Zocor    GERD Continue Pepcid    Tobacco use disorder Patient was counseled on tobacco use cessation    Advance Care Planning: Full code   Consults: None   Family Communication: Family at the bedside.   Severity of Illness: The appropriate patient status for this patient is OBSERVATION. Observation status is judged to be reasonable and necessary in order to provide the required intensity of service to ensure the patient's safety. The patient's presenting symptoms, physical exam findings, and initial radiographic and laboratory data in the context of their medical condition is felt to place them at decreased risk for further clinical deterioration. Furthermore, it is anticipated that the patient will be medically stable for discharge from the hospital within 2 midnights of admission.     Subjective:  Patient seen and examined at the bedside.  She remains on 3 L/min supplemental oxygen, feels somewhat better today.  Still somewhat wheezy on exam.  She says she does not use any supplemental oxygen at home.  She also says that O2 sats at home usually runs between 90-95.  Objective: Vitals:   05/13/24 0700 05/13/24 0830 05/13/24 1015 05/13/24 1018  BP:   (!) 125/57   Pulse: 95 95 97   Resp: 13 14 14    Temp:    97.9 F (36.6 C)  TempSrc:    Oral  SpO2: 95% 95% 97%   Weight:      Height:       No intake or output data in the 24 hours ending 05/13/24 1415 Filed Weights   05/12/24 1534  Weight: 131.5 kg  Examination: O2 sats 95% on 3 L/min oxygen. General: Alert, oriented Chest: Bilateral wheezing, no crackles CVs: S1, S2, no murmur, regular rhythm Abdomen: Soft, nontender Extremities: No edema    Data Reviewed: I have personally reviewed following labs and imaging studies  CBC: Recent Labs  Lab 05/12/24 1536 05/13/24 0003 05/13/24 0400  WBC 5.6 6.3 7.4  NEUTROABS 3.3  --   --   HGB 15.4* 14.5 14.1  HCT 46.6* 43.8 42.4  MCV 95.9 95.8  95.7  PLT 202 213 208   Basic Metabolic Panel: Recent Labs  Lab 05/12/24 1536 05/13/24 0003 05/13/24 0400  NA 138  --  138  K 3.5  --  4.4  CL 97*  --  102  CO2 25  --  27  GLUCOSE 130*  --  135*  BUN 6  --  8  CREATININE 0.76 0.79 0.73  CALCIUM 10.6*  --  10.4*  MG  --   --  2.4  PHOS  --   --  3.1   GFR: Estimated Creatinine Clearance: 106.2 mL/min (by C-G formula based on SCr of 0.73 mg/dL). Liver Function Tests: Recent Labs  Lab 05/12/24 1536 05/13/24 0400  AST 36 42*  ALT 28 25  ALKPHOS 64 56  BILITOT 0.4 0.3  PROT 7.7 7.0  ALBUMIN 4.5 4.2   No results for input(s): LIPASE, AMYLASE in the last 168 hours. No results for input(s): AMMONIA in the last 168 hours. Coagulation Profile: No results for input(s): INR, PROTIME in the last 168 hours. Cardiac Enzymes: No results for input(s): CKTOTAL, CKMB, CKMBINDEX, TROPONINI in the last 168 hours. BNP (last 3 results) Recent Labs    05/12/24 1536  PROBNP <50.0   HbA1C: No results for input(s): HGBA1C in the last 72 hours. CBG: No results for input(s): GLUCAP in the last 168 hours. Lipid Profile: No results for input(s): CHOL, HDL, LDLCALC, TRIG, CHOLHDL, LDLDIRECT in the last 72 hours. Thyroid Function Tests: No results for input(s): TSH, T4TOTAL, FREET4, T3FREE, THYROIDAB in the last 72 hours. Anemia Panel: No results for input(s): VITAMINB12, FOLATE, FERRITIN, TIBC, IRON, RETICCTPCT in the last 72 hours. Sepsis Labs: Recent Labs  Lab 05/12/24 1536 05/12/24 1746 05/13/24 0209 05/13/24 0400  LATICACIDVEN 2.7* 4.5* 1.5 0.9    Recent Results (from the past 240 hours)  Resp panel by RT-PCR (RSV, Flu A&B, Covid) Anterior Nasal Swab     Status: None   Collection Time: 05/12/24  3:33 PM   Specimen: Anterior Nasal Swab  Result Value Ref Range Status   SARS Coronavirus 2 by RT PCR NEGATIVE NEGATIVE Final    Comment: (NOTE) SARS-CoV-2 target nucleic  acids are NOT DETECTED.  The SARS-CoV-2 RNA is generally detectable in upper respiratory specimens during the acute phase of infection. The lowest concentration of SARS-CoV-2 viral copies this assay can detect is 138 copies/mL. A negative result does not preclude SARS-Cov-2 infection and should not be used as the sole basis for treatment or other patient management decisions. A negative result may occur with  improper specimen collection/handling, submission of specimen other than nasopharyngeal swab, presence of viral mutation(s) within the areas targeted by this assay, and inadequate number of viral copies(<138 copies/mL). A negative result must be combined with clinical observations, patient history, and epidemiological information. The expected result is Negative.  Fact Sheet for Patients:  bloggercourse.com  Fact Sheet for Healthcare Providers:  seriousbroker.it  This test is no t yet approved or cleared by the [place]  FDA  and  has been authorized for detection and/or diagnosis of SARS-CoV-2 by FDA under an Emergency Use Authorization (EUA). This EUA will remain  in effect (meaning this test can be used) for the duration of the COVID-19 declaration under Section 564(b)(1) of the Act, 21 U.S.C.section 360bbb-3(b)(1), unless the authorization is terminated  or revoked sooner.       Influenza A by PCR NEGATIVE NEGATIVE Final   Influenza B by PCR NEGATIVE NEGATIVE Final    Comment: (NOTE) The Xpert Xpress SARS-CoV-2/FLU/RSV plus assay is intended as an aid in the diagnosis of influenza from Nasopharyngeal swab specimens and should not be used as a sole basis for treatment. Nasal washings and aspirates are unacceptable for Xpert Xpress SARS-CoV-2/FLU/RSV testing.  Fact Sheet for Patients: bloggercourse.com  Fact Sheet for Healthcare Providers: seriousbroker.it  This  test is not yet approved or cleared by the [place]  FDA and has been authorized for detection and/or diagnosis of SARS-CoV-2 by FDA under an Emergency Use Authorization (EUA). This EUA will remain in effect (meaning this test can be used) for the duration of the COVID-19 declaration under Section 564(b)(1) of the Act, 21 U.S.C. section 360bbb-3(b)(1), unless the authorization is terminated or revoked.     Resp Syncytial Virus by PCR NEGATIVE NEGATIVE Final    Comment: (NOTE) Fact Sheet for Patients: bloggercourse.com  Fact Sheet for Healthcare Providers: seriousbroker.it  This test is not yet approved or cleared by the [place]  FDA and has been authorized for detection and/or diagnosis of SARS-CoV-2 by FDA under an Emergency Use Authorization (EUA). This EUA will remain in effect (meaning this test can be used) for the duration of the COVID-19 declaration under Section 564(b)(1) of the Act, 21 U.S.C. section 360bbb-3(b)(1), unless the authorization is terminated or revoked.  Performed at [facility], [address] [place], [place] [address]          Radiology Studies: CT Angio Chest PE W and/or Wo Contrast Result Date: 05/12/2024 CLINICAL DATA:  Pulmonary embolism suspected, low to intermediate probability, positive D-dimer. Shortness of breath. EXAM: CT ANGIOGRAPHY CHEST WITH CONTRAST TECHNIQUE: Multidetector CT imaging of the chest was performed using the standard protocol during bolus administration of intravenous contrast. Multiplanar CT image reconstructions and MIPs were obtained to evaluate the vascular anatomy. RADIATION DOSE REDUCTION: This exam was performed according to the departmental dose-optimization program which includes automated exposure control, adjustment of the mA and/or kV according to patient size and/or use of iterative reconstruction technique. CONTRAST:  75mL OMNIPAQUE  IOHEXOL  350  MG/ML SOLN COMPARISON:  None Available. FINDINGS: Cardiovascular: The heart is normal in size and there is no pericardial effusion. The aorta is normal in caliber. The pulmonary trunk is borderline distended which may be associated with underlying pulmonary artery hypertension. No large central pulmonary embolus is seen. Evaluation of the pulmonary arteries is limited due to mixing artifact and patient's body habitus. Mediastinum/Nodes: No enlarged mediastinal, hilar, or axillary lymph nodes. Thyroid gland, trachea, and esophagus demonstrate no significant findings. Lungs/Pleura: No consolidation, effusion, or pneumothorax is seen. Upper Abdomen: No acute abnormality. Musculoskeletal: No acute osseous abnormality. Review of the MIP images confirms the above findings. IMPRESSION: 1. No large central pulmonary embolus, evaluation of the distal pulmonary arteries is limited due to mixing artifact and patient's body habitus. 2. No acute process in the chest. Electronically Signed   By: Leita Birmingham M.D.   On: 05/12/2024 17:54   DG Chest Portable 1 View Result Date: 05/12/2024 CLINICAL DATA:  Short  of breath since yesterday EXAM: PORTABLE CHEST 1 VIEW COMPARISON:  09/09/2017 FINDINGS: Single frontal view of the chest demonstrates a stable cardiac silhouette. No acute airspace disease, effusion, or pneumothorax. No acute bony abnormalities. IMPRESSION: 1. Stable chest, no acute process. Electronically Signed   By: Ozell Daring M.D.   On: 05/12/2024 16:13        Scheduled Meds:  [START ON 05/14/2024] azithromycin   250 mg Oral Daily   dextromethorphan -guaiFENesin   1 tablet Oral BID   enoxaparin  (LOVENOX ) injection  0.5 mg/kg Subcutaneous Q24H   ipratropium  0.5 mg Nebulization Q6H   levalbuterol   0.63 mg Nebulization Q6H   methylPREDNISolone  (SOLU-MEDROL ) injection  40 mg Intravenous Q12H   pantoprazole   40 mg Oral Daily   Continuous Infusions:        Derryl Duval, MD [facility] 05/13/2024, 2:15 PM

## 2024-05-13 NOTE — Plan of Care (Signed)

## 2024-05-14 DIAGNOSIS — J9601 Acute respiratory failure with hypoxia: Secondary | ICD-10-CM | POA: Diagnosis present

## 2024-05-14 MED ORDER — IPRATROPIUM BROMIDE 0.02 % IN SOLN
0.5000 mg | Freq: Three times a day (TID) | RESPIRATORY_TRACT | Status: DC
  Administered 2024-05-14 – 2024-05-15 (×3): 0.5 mg via RESPIRATORY_TRACT
  Filled 2024-05-14 (×4): qty 2.5

## 2024-05-14 MED ORDER — LEVALBUTEROL HCL 0.63 MG/3ML IN NEBU
0.6300 mg | INHALATION_SOLUTION | Freq: Three times a day (TID) | RESPIRATORY_TRACT | Status: DC
  Administered 2024-05-14 – 2024-05-15 (×3): 0.63 mg via RESPIRATORY_TRACT
  Filled 2024-05-14 (×4): qty 3

## 2024-05-14 NOTE — Progress Notes (Signed)
Per Dr Griffith, dc tele monitoring  

## 2024-05-14 NOTE — Discharge Instructions (Signed)

## 2024-05-14 NOTE — Plan of Care (Signed)

## 2024-05-14 NOTE — Progress Notes (Addendum)
 PROGRESS NOTE    Kayla Carlson  FMW:[id number] DOB: Sep 11, 1970 DOA: 05/12/2024 PCP: Fernand Fredy RAMAN, MD   Brief Narrative:   Kayla Carlson is a 53 y.o. female with medical history significant of hypertension, asthma hyperlipidemia, tobacco use who presents to the emergency department which started last night but worsened this morning with difficulty in being able to do simple tasks such as taking a shower or walking short distances within the house without being short of breath.  She used home inhaler without relief, so she activated EMS who noted her O2 sat to be 86% on room air.  3 rounds of breathing treatment with nebulizer, Solu-Medrol  and magnesium were given and she was taken to the ED for further evaluation.   ED course  In the emergency department, she was tachycardic, BP was 155/86, O2 sat was 95% on supplemental oxygen via [place] at 2 LPM.  Respiratory panel was negative CT angiography of chest showed known large central pulmonary embolus. EKG showed sinus tachycardia at a rate of 106 bpm with QTc of 491 ms    Assessment & Plan:   Principal Problem:   Asthma exacerbation Active Problems:   Essential hypertension, benign   HLD (hyperlipidemia)   GERD without esophagitis   Tobacco use disorder, continuous   Acute respiratory failure with hypoxia (HCC)   Acute exacerbation of asthma Acute respiratory failure with hypoxia --Continue Xopenex , Atrovent , Mucinex , Azithromycin . --IV Solu-medrol  transitioned to Prednisone  40 mg daily - continue --Continue Protonix  to prevent steroid-induced ulcer --Incentive spirometry and flutter valve --Supplemental oxygen to maintain O2 sat > 90% with plan to wean patient off oxygen as tolerated.  (Patient does not use oxygen at baseline) --Recommend PFT's as outpatient for formal diagnosis --Recommend consideration of adding maintenance inhaler at discharge   Lactic acidosis - Resolved   Essential hypertension Continue amlodipine ,  benazepril    Mixed hyperlipidemia Continue Zocor    GERD Continue Pepcid    Tobacco use disorder Patient was counseled on tobacco use cessation  Morbid obesity (BMI of 53.04) Diet and lifestyle modification Patient may need to follow-up with PCP for weight loss program    Advance Care Planning: Full code   Consults: None   Family Communication: None at the bedside on rounds this morning.     Subjective:  Patient seen with husband at bedside this AM.  Patient reports her breathing has improved and cough feels like it started to break up.  Denies fevers or chills.  She really hopes to go home today and would be agreeable to discharge with home oxygen if her oxygen is not dropping too much with exertion.  Expresses some anxiety being in the hospital and did not expect to have to stay overnight at all.  There are acute complaints.  Objective: Vitals:   05/14/24 0205 05/14/24 0436 05/14/24 0737 05/14/24 0826  BP:  (!) 126/59  (!) 122/50  Pulse:  89  83  Resp:  12  16  Temp:  97.7 F (36.5 C)  98.1 F (36.7 C)  TempSrc:      SpO2: 94% 97% 97% 96%  Weight:      Height:        Intake/Output Summary (Last 24 hours) at 05/14/2024 1302 Last data filed at 05/14/2024 0900 Gross per 24 hour  Intake 240 ml  Output --  Net 240 ml   Filed Weights   05/12/24 1534  Weight: 131.5 kg    Examination:  General exam: awake, alert, no acute distress HEENT: moist mucus  membranes, hearing grossly normal  Respiratory system: diffuse expiratory wheezes, no rhonchi, mildly increased normal respiratory effort at rest with conversational dyspnea, on 3 L/min [place] O2. Cardiovascular system: normal S1/S2, RRR, no JVD, murmurs, rubs, gallops, no pedal edema.   Gastrointestinal system: soft, NT, ND, no HSM felt, +bowel sounds. Central nervous system: A&O x3. no gross focal neurologic deficits, normal speech Extremities: moves all , no edema, normal tone Skin: dry, intact, normal  temperature Psychiatry: normal mood, congruent affect, judgement and insight appear normal     Data Reviewed: I have personally reviewed following labs and imaging studies  CBC: Recent Labs  Lab 05/12/24 1536 05/13/24 0003 05/13/24 0400  WBC 5.6 6.3 7.4  NEUTROABS 3.3  --   --   HGB 15.4* 14.5 14.1  HCT 46.6* 43.8 42.4  MCV 95.9 95.8 95.7  PLT 202 213 208   Basic Metabolic Panel: Recent Labs  Lab 05/12/24 1536 05/13/24 0003 05/13/24 0400  NA 138  --  138  K 3.5  --  4.4  CL 97*  --  102  CO2 25  --  27  GLUCOSE 130*  --  135*  BUN 6  --  8  CREATININE 0.76 0.79 0.73  CALCIUM 10.6*  --  10.4*  MG  --   --  2.4  PHOS  --   --  3.1   GFR: Estimated Creatinine Clearance: 106.2 mL/min (by C-G formula based on SCr of 0.73 mg/dL). Liver Function Tests: Recent Labs  Lab 05/12/24 1536 05/13/24 0400  AST 36 42*  ALT 28 25  ALKPHOS 64 56  BILITOT 0.4 0.3  PROT 7.7 7.0  ALBUMIN 4.5 4.2   No results for input(s): LIPASE, AMYLASE in the last 168 hours. No results for input(s): AMMONIA in the last 168 hours. Coagulation Profile: No results for input(s): INR, PROTIME in the last 168 hours. Cardiac Enzymes: No results for input(s): CKTOTAL, CKMB, CKMBINDEX, TROPONINI in the last 168 hours. BNP (last 3 results) Recent Labs    05/12/24 1536  PROBNP <50.0   HbA1C: No results for input(s): HGBA1C in the last 72 hours. CBG: No results for input(s): GLUCAP in the last 168 hours. Lipid Profile: No results for input(s): CHOL, HDL, LDLCALC, TRIG, CHOLHDL, LDLDIRECT in the last 72 hours. Thyroid Function Tests: No results for input(s): TSH, T4TOTAL, FREET4, T3FREE, THYROIDAB in the last 72 hours. Anemia Panel: No results for input(s): VITAMINB12, FOLATE, FERRITIN, TIBC, IRON, RETICCTPCT in the last 72 hours. Sepsis Labs: Recent Labs  Lab 05/12/24 1536 05/12/24 1746 05/13/24 0209 05/13/24 0400  LATICACIDVEN  2.7* 4.5* 1.5 0.9    Recent Results (from the past 240 hours)  Resp panel by RT-PCR (RSV, Flu A&B, Covid) Anterior Nasal Swab     Status: None   Collection Time: 05/12/24  3:33 PM   Specimen: Anterior Nasal Swab  Result Value Ref Range Status   SARS Coronavirus 2 by RT PCR NEGATIVE NEGATIVE Final    Comment: (NOTE) SARS-CoV-2 target nucleic acids are NOT DETECTED.  The SARS-CoV-2 RNA is generally detectable in upper respiratory specimens during the acute phase of infection. The lowest concentration of SARS-CoV-2 viral copies this assay can detect is 138 copies/mL. A negative result does not preclude SARS-Cov-2 infection and should not be used as the sole basis for treatment or other patient management decisions. A negative result may occur with  improper specimen collection/handling, submission of specimen other than nasopharyngeal swab, presence of viral mutation(s) within the areas targeted  by this assay, and inadequate number of viral copies(<138 copies/mL). A negative result must be combined with clinical observations, patient history, and epidemiological information. The expected result is Negative.  Fact Sheet for Patients:  bloggercourse.com  Fact Sheet for Healthcare Providers:  seriousbroker.it  This test is no t yet approved or cleared by the [place]  FDA and  has been authorized for detection and/or diagnosis of SARS-CoV-2 by FDA under an Emergency Use Authorization (EUA). This EUA will remain  in effect (meaning this test can be used) for the duration of the COVID-19 declaration under Section 564(b)(1) of the Act, 21 U.S.C.section 360bbb-3(b)(1), unless the authorization is terminated  or revoked sooner.       Influenza A by PCR NEGATIVE NEGATIVE Final   Influenza B by PCR NEGATIVE NEGATIVE Final    Comment: (NOTE) The Xpert Xpress SARS-CoV-2/FLU/RSV plus assay is intended as an aid in the diagnosis of  influenza from Nasopharyngeal swab specimens and should not be used as a sole basis for treatment. Nasal washings and aspirates are unacceptable for Xpert Xpress SARS-CoV-2/FLU/RSV testing.  Fact Sheet for Patients: bloggercourse.com  Fact Sheet for Healthcare Providers: seriousbroker.it  This test is not yet approved or cleared by the [place]  FDA and has been authorized for detection and/or diagnosis of SARS-CoV-2 by FDA under an Emergency Use Authorization (EUA). This EUA will remain in effect (meaning this test can be used) for the duration of the COVID-19 declaration under Section 564(b)(1) of the Act, 21 U.S.C. section 360bbb-3(b)(1), unless the authorization is terminated or revoked.     Resp Syncytial Virus by PCR NEGATIVE NEGATIVE Final    Comment: (NOTE) Fact Sheet for Patients: bloggercourse.com  Fact Sheet for Healthcare Providers: seriousbroker.it  This test is not yet approved or cleared by the [place]  FDA and has been authorized for detection and/or diagnosis of SARS-CoV-2 by FDA under an Emergency Use Authorization (EUA). This EUA will remain in effect (meaning this test can be used) for the duration of the COVID-19 declaration under Section 564(b)(1) of the Act, 21 U.S.C. section 360bbb-3(b)(1), unless the authorization is terminated or revoked.  Performed at [facility], [address], [place], [place] [address]          Radiology Studies: CT Angio Chest PE W and/or Wo Contrast Result Date: 05/12/2024 CLINICAL DATA:  Pulmonary embolism suspected, low to intermediate probability, positive D-dimer. Shortness of breath. EXAM: CT ANGIOGRAPHY CHEST WITH CONTRAST TECHNIQUE: Multidetector CT imaging of the chest was performed using the standard protocol during bolus administration of intravenous contrast. Multiplanar CT image  reconstructions and MIPs were obtained to evaluate the vascular anatomy. RADIATION DOSE REDUCTION: This exam was performed according to the departmental dose-optimization program which includes automated exposure control, adjustment of the mA and/or kV according to patient size and/or use of iterative reconstruction technique. CONTRAST:  75mL OMNIPAQUE  IOHEXOL  350 MG/ML SOLN COMPARISON:  None Available. FINDINGS: Cardiovascular: The heart is normal in size and there is no pericardial effusion. The aorta is normal in caliber. The pulmonary trunk is borderline distended which may be associated with underlying pulmonary artery hypertension. No large central pulmonary embolus is seen. Evaluation of the pulmonary arteries is limited due to mixing artifact and patient's body habitus. Mediastinum/Nodes: No enlarged mediastinal, hilar, or axillary lymph nodes. Thyroid gland, trachea, and esophagus demonstrate no significant findings. Lungs/Pleura: No consolidation, effusion, or pneumothorax is seen. Upper Abdomen: No acute abnormality. Musculoskeletal: No acute osseous abnormality. Review of the MIP images confirms the above  findings. IMPRESSION: 1. No large central pulmonary embolus, evaluation of the distal pulmonary arteries is limited due to mixing artifact and patient's body habitus. 2. No acute process in the chest. Electronically Signed   By: Leita Birmingham M.D.   On: 05/12/2024 17:54   DG Chest Portable 1 View Result Date: 05/12/2024 CLINICAL DATA:  Short of breath since yesterday EXAM: PORTABLE CHEST 1 VIEW COMPARISON:  09/09/2017 FINDINGS: Single frontal view of the chest demonstrates a stable cardiac silhouette. No acute airspace disease, effusion, or pneumothorax. No acute bony abnormalities. IMPRESSION: 1. Stable chest, no acute process. Electronically Signed   By: Ozell Daring M.D.   On: 05/12/2024 16:13        Scheduled Meds:  amLODipine   5 mg Oral Daily   azithromycin   250 mg Oral Daily    benazepril   10 mg Oral Daily   dextromethorphan -guaiFENesin   1 tablet Oral BID   enoxaparin  (LOVENOX ) injection  0.5 mg/kg Subcutaneous Q24H   ipratropium  0.5 mg Nebulization Q6H   levalbuterol   0.63 mg Nebulization Q6H   pantoprazole   40 mg Oral Daily   predniSONE   40 mg Oral Q breakfast   simvastatin   10 mg Oral q1800   Continuous Infusions:        Burnard DELENA Cunning, DO [facility] 05/14/2024, 1:02 PM

## 2024-05-14 NOTE — Progress Notes (Signed)
 Pt sats were 90% on RA at rest. Pt stood up and walked from side of the bed to door and became visibly SOB sats dropped to 84%, Placed on 6L of oxygen to recover. Placed back to bed and weaned oxygen back down to 4L at 95%.

## 2024-05-14 NOTE — Plan of Care (Signed)

## 2024-05-15 ENCOUNTER — Other Ambulatory Visit (HOSPITAL_COMMUNITY): Payer: Self-pay

## 2024-05-15 ENCOUNTER — Other Ambulatory Visit: Payer: Self-pay

## 2024-05-15 ENCOUNTER — Telehealth (HOSPITAL_COMMUNITY): Payer: Self-pay

## 2024-05-15 DIAGNOSIS — J45901 Unspecified asthma with (acute) exacerbation: Secondary | ICD-10-CM | POA: Diagnosis not present

## 2024-05-15 DIAGNOSIS — F17209 Nicotine dependence, unspecified, with unspecified nicotine-induced disorders: Secondary | ICD-10-CM

## 2024-05-15 DIAGNOSIS — J9601 Acute respiratory failure with hypoxia: Secondary | ICD-10-CM | POA: Diagnosis not present

## 2024-05-15 LAB — RESPIRATORY PANEL BY PCR

## 2024-05-15 MED ORDER — PREDNISONE 20 MG PO TABS: ORAL_TABLET | ORAL | 0 refills | Status: AC

## 2024-05-15 MED ORDER — BUDESONIDE-FORMOTEROL FUMARATE 160-4.5 MCG/ACT IN AERO: 2.0000 | INHALATION_SPRAY | Freq: Two times a day (BID) | RESPIRATORY_TRACT | 1 refills | Status: DC

## 2024-05-15 MED ORDER — FLUTICASONE FUROATE-VILANTEROL 200-25 MCG/ACT IN AEPB
1.0000 | INHALATION_SPRAY | Freq: Every day | RESPIRATORY_TRACT | Status: DC
  Filled 2024-05-15: qty 28

## 2024-05-15 MED ORDER — AZITHROMYCIN 250 MG PO TABS
250.0000 mg | ORAL_TABLET | Freq: Every day | ORAL | 0 refills | Status: DC
  Filled 2024-05-15: qty 2, 2d supply, fill #0

## 2024-05-15 MED ORDER — DM-GUAIFENESIN ER 30-600 MG PO TB12
1.0000 | ORAL_TABLET | Freq: Two times a day (BID) | ORAL | 0 refills | Status: DC
  Filled 2024-05-15: qty 10, 5d supply, fill #0

## 2024-05-15 MED ORDER — DM-GUAIFENESIN ER 30-600 MG PO TB12: 1.0000 | ORAL_TABLET | Freq: Two times a day (BID) | ORAL | 0 refills | Status: AC

## 2024-05-15 MED ORDER — PREDNISONE 20 MG PO TABS
ORAL_TABLET | ORAL | 0 refills | Status: DC
  Filled 2024-05-15: qty 11, 10d supply, fill #0

## 2024-05-15 MED ORDER — BUDESONIDE-FORMOTEROL FUMARATE 160-4.5 MCG/ACT IN AERO
2.0000 | INHALATION_SPRAY | Freq: Two times a day (BID) | RESPIRATORY_TRACT | 1 refills | Status: DC
  Filled 2024-05-15: qty 10.2, 30d supply, fill #0

## 2024-05-15 MED ORDER — IPRATROPIUM-ALBUTEROL 0.5-2.5 (3) MG/3ML IN SOLN: 3.0000 mL | Freq: Four times a day (QID) | RESPIRATORY_TRACT | 1 refills | Status: AC | PRN

## 2024-05-15 MED ORDER — AZITHROMYCIN 250 MG PO TABS: 250.0000 mg | ORAL_TABLET | Freq: Every day | ORAL | 0 refills | Status: AC

## 2024-05-15 MED ORDER — IPRATROPIUM-ALBUTEROL 0.5-2.5 (3) MG/3ML IN SOLN
3.0000 mL | Freq: Four times a day (QID) | RESPIRATORY_TRACT | 1 refills | Status: DC | PRN
  Filled 2024-05-15: qty 45, 4d supply, fill #0

## 2024-05-15 NOTE — Telephone Encounter (Signed)
 Pharmacy Patient Advocate Encounter  Insurance verification completed.    The patient is insured through [facility]. Patient has [employer], may use a copay card, and/or apply for patient assistance if available.    Ran test claim for Breo Ellipta  200-37mcg and the current 30 day co-pay is $391.28.  Ran test claim for Symbicort  [id number] and the current 30 day co-pay is $0.00  This test claim was processed through [facility]- copay amounts may vary at other pharmacies due to [employer], or as the patient moves through the different stages of their insurance plan.

## 2024-05-15 NOTE — Discharge Summary (Addendum)
 Physician Discharge Summary   Patient: Kayla Carlson MRN: [id number] DOB: 02-28-1971  Admit date:     05/12/2024  Discharge date: 05/15/24  Discharge Physician: Laree Lock   PCP: Fernand Fredy RAMAN, MD   Recommendations at discharge:   Follow-up with PCP in 1-2 weeks -repeat CMP  Pulmonary outpatient referral provided Discharge on home oxygen  Discharge Diagnoses: Principal Problem:   Asthma exacerbation Active Problems:   Essential hypertension, benign   HLD (hyperlipidemia)   GERD without esophagitis   Tobacco use disorder, continuous   Acute respiratory failure with hypoxia [facility])   Hospital Course: Kayla Carlson is a 53 y.o. female with medical history significant of hypertension, asthma hyperlipidemia, tobacco use who presented to the ER due to worsening SOB, and was desaturating.  Patient admitted for acute hypoxic respiratory failure and exacerbation of asthma/COPD.  Hospital course as below.  Patient insisted she would like to go home today  Acute exacerbation of asthma/COPD Acute respiratory failure with hypoxia - improving - S/p IV Solu-Medrol , discharged on p.o. prednisone  taper, complete azithromycin  - Has been having uncontrolled symptoms and using albuterol  as needed multiple times during the day, prior to exacerbation - Start Symbicort  for maintenance, albuterol  as needed, DuoNebs as needed - Requiring supplemental oxygen at rest 2 L [place], and on exertion up to 3 L [place] - home oxygen arranged - Pulmonary/sleep clinic referral at discharge - RVP pending (husband had similar symptoms 1 week ago which resolved)   Lactic acidosis - Resolved   Essential hypertension   Mixed hyperlipidemia   GERD   Tobacco use disorder Patient was counseled on tobacco use cessation   Morbid obesity (BMI of 53.04) Diet and lifestyle modification Patient may need to follow-up with PCP for weight loss program  Pain control - English  Controlled Substance Reporting  System database was reviewed. and patient was instructed, not to drive, operate heavy machinery, perform activities at heights, swimming or participation in water activities or provide baby-sitting services while on Pain, Sleep and Anxiety Medications; until their outpatient Physician has advised to do so again. Also recommended to not to take more than prescribed Pain, Sleep and Anxiety Medications.  Consultants: None Procedures performed: None  Disposition: Home Diet recommendation:  Discharge Diet Orders (From admission, onward)     Start     Ordered   05/15/24 0000  Diet - low sodium heart healthy        05/15/24 1411           DISCHARGE MEDICATION: Allergies as of 05/15/2024       Reactions   Codeine    Doxycycline Itching, Swelling   Tongue turned   Lisinopril         Medication List     STOP taking these medications    naproxen 250 MG tablet Commonly known as: NAPROSYN       TAKE these medications    albuterol  108 (90 Base) MCG/ACT inhaler Commonly known as: VENTOLIN  HFA INHALE 1 TO 2 PUFFS BY MOUTH EVERY 4 TO 6 HOURS AS NEEDED FOR SHORTNESS OF BREATH   amLODipine  5 MG tablet Commonly known as: NORVASC  TAKE 1 TABLET BY MOUTH EVERY DAY   azithromycin  250 MG tablet Commonly known as: ZITHROMAX  Take 1 tablet (250 mg total) by mouth daily. Start taking on: May 16, 2024   benazepril  10 MG tablet Commonly known as: LOTENSIN  TAKE 1 TABLET(10 MG) BY MOUTH DAILY   budesonide -formoterol  160-4.5 MCG/ACT inhaler Commonly known as: Symbicort  Inhale 2 puffs  into the lungs in the morning and at bedtime.   dextromethorphan -guaiFENesin  30-600 MG 12hr tablet Commonly known as: MUCINEX  DM Take 1 tablet by mouth 2 (two) times daily.   famotidine  20 MG tablet Commonly known as: PEPCID  Take 20 mg by mouth at bedtime as needed for heartburn or indigestion.   furosemide  20 MG tablet Commonly known as: Lasix  Take 1 tablet (20 mg total) by mouth daily.    ibuprofen 200 MG tablet Commonly known as: ADVIL Take 400 mg by mouth daily as needed for mild pain (pain score 1-3) or moderate pain (pain score 4-6).   ipratropium-albuterol  0.5-2.5 (3) MG/3ML Soln Commonly known as: DUONEB Take 3 mLs by nebulization every 6 (six) hours as needed.   predniSONE  20 MG tablet Commonly known as: DELTASONE  Take 2 tablets (40 mg total) by mouth daily with breakfast for 1 day, THEN 1.5 tablets (30 mg total) daily with breakfast for 3 days, THEN 1 tablet (20 mg total) daily with breakfast for 3 days, THEN 0.5 tablets (10 mg total) daily with breakfast for 3 days. Start taking on: May 16, 2024   simvastatin  10 MG tablet Commonly known as: ZOCOR  TAKE 1 TABLET(10 MG) BY MOUTH DAILY   spironolactone  50 MG tablet Commonly known as: ALDACTONE  TAKE 1 TABLET(50 MG) BY MOUTH DAILY   Vitamin D  (Ergocalciferol ) 1.25 MG (50000 UNIT) Caps capsule Commonly known as: DRISDOL TAKE 1 CAPSULE BY MOUTH EVERY WEEK               Durable Medical Equipment  (From admission, onward)           Start     Ordered   05/15/24 0000  For home use only DME oxygen       Question Answer Comment  Length of Need 6 Months   Mode or (Route) Nasal cannula   Liters per Minute 2   Frequency Continuous (stationary and portable oxygen unit needed)   Oxygen delivery system: Gas      05/15/24 1402   05/15/24 0000  For home use only DME Nebulizer machine       Question Answer Comment  Patient needs a nebulizer to treat with the following condition Acute hypoxemic respiratory failure (HCC)   Patient needs a nebulizer to treat with the following condition COPD (chronic obstructive pulmonary disease) (HCC)   Patient needs a nebulizer to treat with the following condition Asthma   Length of Need 6 Months   Additional equipment included Administration kit      05/15/24 1408            Follow-up Information     Fernand Fredy RAMAN, MD Follow up.   Specialty: Internal  Medicine Why: hospital follow up Contact information: [address] [place] [address] [phone]                Discharge Exam: Filed Weights   05/12/24 1534  Weight: 131.5 kg   General exam: awake, alert, no acute distress Respiratory system: Mild wheezing bilateral bases, no rhonchi, no accessory muscle use Cardiovascular system: normal S1/S2, RRR, no JVD, murmurs, rubs, gallops, no pedal edema.   Gastrointestinal system: soft, NT, ND, no HSM felt, +bowel sounds. Central nervous system: A&O x3. no gross focal neurologic deficits, normal speech Extremities: moves all , no edema, normal tone Skin: dry, intact, normal temperature Psychiatry: normal mood, congruent affect, judgement and insight appear normal  Condition at discharge: fair  The results of significant diagnostics from this hospitalization (including  imaging, microbiology, ancillary and laboratory) are listed below for reference.   Imaging Studies: CT Angio Chest PE W and/or Wo Contrast Result Date: 05/12/2024 CLINICAL DATA:  Pulmonary embolism suspected, low to intermediate probability, positive D-dimer. Shortness of breath. EXAM: CT ANGIOGRAPHY CHEST WITH CONTRAST TECHNIQUE: Multidetector CT imaging of the chest was performed using the standard protocol during bolus administration of intravenous contrast. Multiplanar CT image reconstructions and MIPs were obtained to evaluate the vascular anatomy. RADIATION DOSE REDUCTION: This exam was performed according to the departmental dose-optimization program which includes automated exposure control, adjustment of the mA and/or kV according to patient size and/or use of iterative reconstruction technique. CONTRAST:  75mL OMNIPAQUE  IOHEXOL  350 MG/ML SOLN COMPARISON:  None Available. FINDINGS: Cardiovascular: The heart is normal in size and there is no pericardial effusion. The aorta is normal in caliber. The pulmonary trunk is borderline distended which may be  associated with underlying pulmonary artery hypertension. No large central pulmonary embolus is seen. Evaluation of the pulmonary arteries is limited due to mixing artifact and patient's body habitus. Mediastinum/Nodes: No enlarged mediastinal, hilar, or axillary lymph nodes. Thyroid gland, trachea, and esophagus demonstrate no significant findings. Lungs/Pleura: No consolidation, effusion, or pneumothorax is seen. Upper Abdomen: No acute abnormality. Musculoskeletal: No acute osseous abnormality. Review of the MIP images confirms the above findings. IMPRESSION: 1. No large central pulmonary embolus, evaluation of the distal pulmonary arteries is limited due to mixing artifact and patient's body habitus. 2. No acute process in the chest. Electronically Signed   By: Leita Birmingham M.D.   On: 05/12/2024 17:54   DG Chest Portable 1 View Result Date: 05/12/2024 CLINICAL DATA:  Short of breath since yesterday EXAM: PORTABLE CHEST 1 VIEW COMPARISON:  09/09/2017 FINDINGS: Single frontal view of the chest demonstrates a stable cardiac silhouette. No acute airspace disease, effusion, or pneumothorax. No acute bony abnormalities. IMPRESSION: 1. Stable chest, no acute process. Electronically Signed   By: Ozell Daring M.D.   On: 05/12/2024 16:13    Microbiology: Results for orders placed or performed during the hospital encounter of 05/12/24  Resp panel by RT-PCR (RSV, Flu A&B, Covid) Anterior Nasal Swab     Status: None   Collection Time: 05/12/24  3:33 PM   Specimen: Anterior Nasal Swab  Result Value Ref Range Status   SARS Coronavirus 2 by RT PCR NEGATIVE NEGATIVE Final    Comment: (NOTE) SARS-CoV-2 target nucleic acids are NOT DETECTED.  The SARS-CoV-2 RNA is generally detectable in upper respiratory specimens during the acute phase of infection. The lowest concentration of SARS-CoV-2 viral copies this assay can detect is 138 copies/mL. A negative result does not preclude SARS-Cov-2 infection and should  not be used as the sole basis for treatment or other patient management decisions. A negative result may occur with  improper specimen collection/handling, submission of specimen other than nasopharyngeal swab, presence of viral mutation(s) within the areas targeted by this assay, and inadequate number of viral copies(<138 copies/mL). A negative result must be combined with clinical observations, patient history, and epidemiological information. The expected result is Negative.  Fact Sheet for Patients:  bloggercourse.com  Fact Sheet for Healthcare Providers:  seriousbroker.it  This test is no t yet approved or cleared by the [place]  FDA and  has been authorized for detection and/or diagnosis of SARS-CoV-2 by FDA under an Emergency Use Authorization (EUA). This EUA will remain  in effect (meaning this test can be used) for the duration of the COVID-19 declaration under Section 564(b)(1)  of the Act, 21 U.S.C.section 360bbb-3(b)(1), unless the authorization is terminated  or revoked sooner.       Influenza A by PCR NEGATIVE NEGATIVE Final   Influenza B by PCR NEGATIVE NEGATIVE Final    Comment: (NOTE) The Xpert Xpress SARS-CoV-2/FLU/RSV plus assay is intended as an aid in the diagnosis of influenza from Nasopharyngeal swab specimens and should not be used as a sole basis for treatment. Nasal washings and aspirates are unacceptable for Xpert Xpress SARS-CoV-2/FLU/RSV testing.  Fact Sheet for Patients: bloggercourse.com  Fact Sheet for Healthcare Providers: seriousbroker.it  This test is not yet approved or cleared by the [place]  FDA and has been authorized for detection and/or diagnosis of SARS-CoV-2 by FDA under an Emergency Use Authorization (EUA). This EUA will remain in effect (meaning this test can be used) for the duration of the COVID-19 declaration under  Section 564(b)(1) of the Act, 21 U.S.C. section 360bbb-3(b)(1), unless the authorization is terminated or revoked.     Resp Syncytial Virus by PCR NEGATIVE NEGATIVE Final    Comment: (NOTE) Fact Sheet for Patients: bloggercourse.com  Fact Sheet for Healthcare Providers: seriousbroker.it  This test is not yet approved or cleared by the [place]  FDA and has been authorized for detection and/or diagnosis of SARS-CoV-2 by FDA under an Emergency Use Authorization (EUA). This EUA will remain in effect (meaning this test can be used) for the duration of the COVID-19 declaration under Section 564(b)(1) of the Act, 21 U.S.C. section 360bbb-3(b)(1), unless the authorization is terminated or revoked.  Performed at [facility], [address] [place], [place] [address]     Labs: CBC: Recent Labs  Lab 05/12/24 1536 05/13/24 0003 05/13/24 0400  WBC 5.6 6.3 7.4  NEUTROABS 3.3  --   --   HGB 15.4* 14.5 14.1  HCT 46.6* 43.8 42.4  MCV 95.9 95.8 95.7  PLT 202 213 208   Basic Metabolic Panel: Recent Labs  Lab 05/12/24 1536 05/13/24 0003 05/13/24 0400  NA 138  --  138  K 3.5  --  4.4  CL 97*  --  102  CO2 25  --  27  GLUCOSE 130*  --  135*  BUN 6  --  8  CREATININE 0.76 0.79 0.73  CALCIUM 10.6*  --  10.4*  MG  --   --  2.4  PHOS  --   --  3.1   Liver Function Tests: Recent Labs  Lab 05/12/24 1536 05/13/24 0400  AST 36 42*  ALT 28 25  ALKPHOS 64 56  BILITOT 0.4 0.3  PROT 7.7 7.0  ALBUMIN 4.5 4.2   CBG: No results for input(s): GLUCAP in the last 168 hours.  Discharge time spent: greater than 30 minutes.  Signed: Laree Lock, MD [facility] 05/15/2024

## 2024-05-15 NOTE — TOC Initial Note (Signed)
 Transition of Care [facility]) - Initial/Assessment Note    Patient Details  Name: Kayla Carlson MRN: [id number] Date of Birth: [birth year]  Transition of [facility]) CM/SW Contact:    Nathanael CHRISTELLA Ring, RN Phone Number: 05/15/2024, 2:16 PM  Clinical Narrative:                  Patient admitted to the hospital with asthma exacerbation requiring oxygen.  CM met with patient and her husband at the bedside, introduced self and explained role in discharge planning.  She wants to go home today, she needs oxygen to be able to go home.  She is agreeable to have [employer] deliver oxygen equipment.  They live in a house in [place], tanks will be delivered here to the room and a concentrator to the home once she gets home.  Educated that she cannot smoke while on oxygen and that if she does smoke she needs to take the oxygen off and go outside as oxygen is highly flamable.  She and her husband verbalize understanding.  She is current with her PCP and has an appointment already scheduled next Friday.  Nebulizer also ordered from [employer].  Husband will transport her home, no other TOC needs identified at this time.  Expected Discharge Plan: Home/Self Care Barriers to Discharge: Equipment Delay   Patient Goals and CMS Choice Patient states their goals for this hospitalization and ongoing recovery are:: Wants to go home today          Expected Discharge Plan and Services   Discharge Planning Services: CM Consult   Living arrangements for the past 2 months: Single Family Home Expected Discharge Date: 05/15/24               DME Arranged: Oxygen, Nebulizer machine DME Agency: [employer] Date DME Agency Contacted: 05/15/24 Time DME Agency Contacted: 1415 Representative spoke with at DME Agency: [facility] HH Arranged: NA          Prior Living Arrangements/Services Living arrangements for the past 2 months: Single Family Home Lives with:: Spouse Patient language and need for interpreter reviewed:: Yes Do you  feel safe going back to the place where you live?: Yes      Need for Family Participation in Patient Care: Yes (Comment) Care giver support system in place?: Yes (comment)   Criminal Activity/Legal Involvement Pertinent to Current Situation/Hospitalization: No - Comment as needed  Activities of Daily Living   ADL Screening (condition at time of admission) Independently performs ADLs?: Yes (appropriate for developmental age) Is the patient deaf or have difficulty hearing?: No Does the patient have difficulty seeing, even when wearing glasses/contacts?: No Does the patient have difficulty concentrating, remembering, or making decisions?: No  Permission Sought/Granted Permission sought to share information with : Other (comment) Permission granted to share information with : Yes, Verbal Permission Granted  Share Information with NAME: Evalene Falter  Permission granted to share info w AGENCY: [employer]  Permission granted to share info w Relationship: spouse  Permission granted to share info w Contact Information: [phone]  Emotional Assessment Appearance:: Appears stated age Attitude/Demeanor/Rapport: Engaged Affect (typically observed): Accepting Orientation: : Oriented to Self, Oriented to Place, Oriented to  Time, Oriented to Situation Alcohol / Substance Use: Tobacco Use Psych Involvement: No (comment)  Admission diagnosis:  Acute respiratory failure with hypoxia (HCC) [J96.01] Asthma exacerbation [J45.901] Exacerbation of asthma, unspecified asthma severity, unspecified whether persistent [J45.901] Patient Active Problem List   Diagnosis Date Noted   Acute respiratory failure with hypoxia (HCC)  05/14/2024   Asthma exacerbation 05/12/2024   Obesity, morbid, BMI 50 or higher (HCC) 12/01/2023   Vitamin D  deficiency 12/01/2023   Tobacco use disorder, continuous 05/26/2023   GERD without esophagitis 08/05/2022   Asthma without status asthmaticus 08/05/2022   Cigarette  nicotine  dependence without complication 08/05/2022   Bartholin's gland abscess 11/19/2019   Essential hypertension, benign 09/10/2017   COPD exacerbation (HCC) 09/10/2017   HLD (hyperlipidemia) 09/10/2017   Anxiety 09/10/2017   Respiratory distress 09/10/2017   PCP:  Fernand Fredy RAMAN, MD Pharmacy:   [id number] [place],  - [address] AT [facility] OF [address] [place] [address] Phone: [phone] Fax: [phone]  [facility] [place], [place] - [address] [facility] [place] [address] Phone: [phone] Fax: [phone]  [facility] [address] [place] [address] Phone: [phone] Fax: [phone]     Social Drivers of Health (SDOH) Social History: SDOH Screenings   Food Insecurity: No Food Insecurity (05/13/2024)  Housing: Unknown (05/13/2024)  Transportation Needs: No Transportation Needs (05/13/2024)  Utilities: Not At Risk (05/13/2024)  Depression (PHQ2-9): Low Risk  (05/26/2023)  Financial Resource Strain: Low Risk  (08/10/2023)   Received from [facility]  Social Connections: Moderately Isolated (05/13/2024)  Tobacco Use: High Risk (05/13/2024)   SDOH Interventions: Social Connections Interventions: Inpatient TOC, Other (Comment) (Resources added to AVS)   Readmission Risk Interventions     No data to display

## 2024-05-15 NOTE — Progress Notes (Signed)
 Room air Sitting 91% Standing 89% Walking 85%  Placed on 4 liters to recover  Walking needs 3 L to stay at 92%

## 2024-05-15 NOTE — Plan of Care (Signed)
 IV removed and discharge instructions reviewed.  Home oxygen and nebulizer machine delivered to room.  Patient advised to call pulmonology for a follow up appointment and to continue to monitor O2 saturation level and to contact her primary care physician or go to the emergency department if she needs to use more than 4L of oxygen.  Patient stated that she understood instructions

## 2024-05-15 NOTE — Plan of Care (Signed)

## 2024-05-16 ENCOUNTER — Telehealth: Payer: Self-pay

## 2024-05-16 NOTE — Transitions of Care (Post Inpatient/ED Visit) (Signed)
 05/16/2024  Name: Kayla Carlson MRN: [id number] DOB: Mar 26, 1971  Today's TOC FU Call Status: Today's TOC FU Call Status:: Successful TOC FU Call Completed TOC FU Call Complete Date: 05/16/24  Patient's Name and Date of Birth confirmed. Name, DOB  Transition Care Management Follow-up Telephone Call Date of Discharge: 05/15/24 Discharge Facility: [facility]) Type of Discharge: Inpatient Admission Primary Inpatient Discharge Diagnosis:: Respiratory Failure How have you been since you were released from the hospital?: Better Any questions or concerns?: No  Items Reviewed: Did you receive and understand the discharge instructions provided?: Yes Medications obtained,verified, and reconciled?: Yes (Medications Reviewed) Any new allergies since your discharge?: No Dietary orders reviewed?: Yes Type of Diet Ordered:: Low Sodium Heart Healthy Do you have support at home?: Yes People in Home [RPT]: spouse Name of Support/Comfort Primary Source: Tomothy Altier  Medications Reviewed Today: Medications Reviewed Today     Reviewed by Moises Reusing, RN (Case Manager) on 05/16/24 at 1024  Med List Status: <None>   Medication Order Taking? Sig Documenting Provider Last Dose Status Informant  albuterol  (VENTOLIN  HFA) 108 (90 Base) MCG/ACT inhaler [id number]  INHALE 1 TO 2 PUFFS BY MOUTH EVERY 4 TO 6 HOURS AS NEEDED FOR SHORTNESS OF BREATH Fernand Fredy RAMAN, MD  Active Self  amLODipine  (NORVASC ) 5 MG tablet [id number]  TAKE 1 TABLET BY MOUTH EVERY DAY Fernand Fredy RAMAN, MD  Active Self  azithromycin  (ZITHROMAX ) 250 MG tablet [id number]  Take 1 tablet (250 mg total) by mouth daily. Jerelene Critchley, MD  Active   benazepril  (LOTENSIN ) 10 MG tablet [id number]  TAKE 1 TABLET(10 MG) BY MOUTH DAILY Fernand Fredy RAMAN, MD  Active Self  budesonide -formoterol  (SYMBICORT ) 160-4.5 MCG/ACT inhaler [id number]  Inhale 2 puffs into the lungs in the morning and at bedtime. Jerelene Critchley, MD  Active   dextromethorphan -guaiFENesin  (MUCINEX  DM) 30-600 MG 12hr tablet [id number]  Take 1 tablet by mouth 2 (two) times daily. Jerelene Critchley, MD  Active   famotidine  (PEPCID ) 20 MG tablet [id number]  Take 20 mg by mouth at bedtime as needed for heartburn or indigestion. [provider]  Active Self  furosemide  (LASIX ) 20 MG tablet [id number]  Take 1 tablet (20 mg total) by mouth daily. Fernand Fredy RAMAN, MD  Active Self           Med Note VASHTI INOCENTE JINNY Austin May 12, 2024 10:33 PM) Told by Dr to take a couple times a week.  ibuprofen (ADVIL) 200 MG tablet [id number]  Take 400 mg by mouth daily as needed for mild pain (pain score 1-3) or moderate pain (pain score 4-6). [provider]  Active Self  ipratropium-albuterol  (DUONEB) 0.5-2.5 (3) MG/3ML SOLN [id number]  Take 3 mLs by nebulization every 6 (six) hours as needed. Jerelene Critchley, MD  Active   predniSONE  (DELTASONE ) 20 MG tablet [id number]  Take 2 tablets (40 mg total) by mouth daily with breakfast for 1 day, THEN 1.5 tablets (30 mg total) daily with breakfast for 3 days, THEN 1 tablet (20 mg total) daily with breakfast for 3 days, THEN 0.5 tablets (10 mg total) daily with breakfast for 3 days. Jerelene Critchley, MD  Active   simvastatin  (ZOCOR ) 10 MG tablet [id number]  TAKE 1 TABLET(10 MG) BY MOUTH DAILY Fernand Fredy RAMAN, MD  Active Self  spironolactone  (ALDACTONE ) 50 MG tablet [id number]  TAKE 1 TABLET(50 MG) BY MOUTH DAILY Fernand Fredy RAMAN, MD  Active Self  Vitamin D , Ergocalciferol , (DRISDOL) 1.25 MG (50000  UNIT) CAPS capsule [id number]  TAKE 1 CAPSULE BY MOUTH EVERY WEEK Fernand Fredy RAMAN, MD  Active Self            Home Care and Equipment/Supplies: Were Home Health Services Ordered?: NA Any new equipment or medical supplies ordered?: Yes Name of Medical supply agency?: [employer] Were you able to get the equipment/medical supplies?: Yes Do you have any questions related to the use of the equipment/supplies?:  No  Functional Questionnaire: Do you need assistance with bathing/showering or dressing?: No Do you need assistance with meal preparation?: No Do you need assistance with eating?: No Do you have difficulty maintaining continence: No Do you need assistance with getting out of bed/getting out of a chair/moving?: No Do you have difficulty managing or taking your medications?: No  Follow up appointments reviewed: PCP Follow-up appointment confirmed?: Yes Date of PCP follow-up appointment?: 05/24/24 Follow-up Provider: Fredy Fernand Driscilla Lionel Follow-up appointment confirmed?: Yes Date of Specialist follow-up appointment?: 07/03/24 Follow-Up Specialty Provider:: Dr. Isaiah Do you need transportation to your follow-up appointment?: No Do you understand care options if your condition(s) worsen?: Yes-patient verbalized understanding  SDOH Interventions Today    Flowsheet Row Most Recent Value  SDOH Interventions   Food Insecurity Interventions Intervention Not Indicated  Housing Interventions Intervention Not Indicated  Transportation Interventions Intervention Not Indicated  Utilities Interventions Intervention Not Indicated   Medford Balboa, BSN, RN [place]  [facility] RN Care Manager [phone]

## 2024-05-24 ENCOUNTER — Ambulatory Visit: Admitting: Internal Medicine

## 2024-05-24 ENCOUNTER — Encounter: Payer: Self-pay | Admitting: Internal Medicine

## 2024-05-24 VITALS — BP 144/86 | HR 105 | Ht 62.0 in | Wt 293.0 lb

## 2024-05-24 DIAGNOSIS — E559 Vitamin D deficiency, unspecified: Secondary | ICD-10-CM

## 2024-05-24 DIAGNOSIS — N61 Mastitis without abscess: Secondary | ICD-10-CM

## 2024-05-24 DIAGNOSIS — R7309 Other abnormal glucose: Secondary | ICD-10-CM | POA: Diagnosis not present

## 2024-05-24 DIAGNOSIS — I10 Essential (primary) hypertension: Secondary | ICD-10-CM | POA: Diagnosis not present

## 2024-05-24 DIAGNOSIS — J4540 Moderate persistent asthma, uncomplicated: Secondary | ICD-10-CM

## 2024-05-24 DIAGNOSIS — Z1211 Encounter for screening for malignant neoplasm of colon: Secondary | ICD-10-CM | POA: Diagnosis not present

## 2024-05-24 DIAGNOSIS — E782 Mixed hyperlipidemia: Secondary | ICD-10-CM | POA: Diagnosis not present

## 2024-05-24 DIAGNOSIS — F17209 Nicotine dependence, unspecified, with unspecified nicotine-induced disorders: Secondary | ICD-10-CM

## 2024-05-24 MED ORDER — AMOXICILLIN-POT CLAVULANATE 875-125 MG PO TABS: 1.0000 | ORAL_TABLET | Freq: Two times a day (BID) | ORAL | 0 refills | Status: AC

## 2024-05-24 NOTE — Progress Notes (Signed)
 "  Established Patient Office Visit  Subjective:  Patient ID: Kayla Carlson, female    DOB: [birth year]  Age: 53 y.o. MRN: [id number]  Chief Complaint  Patient presents with   Hospitalization Follow-up    Patient comes in for hospital follow-up.  She was recently admitted to [facility] from 05/12/2024-05/15/2024 for Acute exacerbation of asthma and COPD, and Hypoxia. She was stabilized with I/v Solumedrol and discharged on po Prednisone  Taper and Zithromax . She was also sent home on 2L of 02 via [place]. Symbicort  inhaler was added as maintenance. Today she is feeling much better, no shortness of breath , no wheezing, but she has resumed smoking again. She has not needed supplemental oxygen at home at all- will discontinue. She had a negative CT chest while in hospital. Patient had her mammogram this year. Need to get Cologuard. Labs today. Understands the need to lose weight, reluctant to start GLP-1. Wants to try herself with diet control. Mentions a painful, tender red spot under her left breast - for last 2 days, did not burst, but today feels slightly better. On exam there is a 3x3 cm patch of cellulitis, with redness, swelling and a central small closed abscess. Patient advised warm compresses, and start po Augmentin. BP is high toady, says she did take her meds, need to monitor.    No other concerns at this time.   Past Medical History:  Diagnosis Date   Asthma    GAD (generalized anxiety disorder)    GERD without esophagitis    Hypertension    Mixed hyperlipidemia     Past Surgical History:  Procedure Laterality Date   ABDOMINAL HYSTERECTOMY  2010   BREAST EXCISIONAL BIOPSY Left 2013   benign    Social History   Socioeconomic History   Marital status: Married    Spouse name: Not on file   Number of children: Not on file   Years of education: Not on file   Highest education level: Not on file  Occupational History   Not on file  Tobacco Use   Smoking status: Former     Current packs/day: 0.00    Average packs/day: 2.0 packs/day for 35.9 years (71.9 ttl pk-yrs)    Types: Cigarettes    Start date: 26    Quit date: 05/13/2024    Years since quitting: 0.0   Smokeless tobacco: Never  Vaping Use   Vaping status: Never Used  Substance and Sexual Activity   Alcohol use: Never   Drug use: Never   Sexual activity: Yes  Other Topics Concern   Not on file  Social History Narrative   Not on file   Social Drivers of Health   Tobacco Use: Medium Risk (05/24/2024)   Patient History    Smoking Tobacco Use: Former    Smokeless Tobacco Use: Never    Passive Exposure: Not on file  Financial Resource Strain: Low Risk  (08/10/2023)   Received from [facility]   Overall Financial Resource Strain (CARDIA)    Difficulty of Paying Living Expenses: Not hard at all  Food Insecurity: No Food Insecurity (05/16/2024)   Epic    Worried About Radiation Protection Practitioner of Food in the Last Year: Never true    Ran Out of Food in the Last Year: Never true  Transportation Needs: No Transportation Needs (05/16/2024)   Epic    Lack of Transportation (Medical): No    Lack of Transportation (Non-Medical): No  Physical Activity: Not on file  Stress: Not  on file  Social Connections: Moderately Isolated (05/13/2024)   Social Connection and Isolation Panel    Frequency of Communication with Friends and Family: More than three times a week    Frequency of Social Gatherings with Friends and Family: Once a week    Attends Religious Services: Never    Database Administrator or Organizations: No    Attends Banker Meetings: Never    Marital Status: Married  Catering Manager Violence: Not At Risk (05/16/2024)   Epic    Fear of Current or Ex-Partner: No    Emotionally Abused: No    Physically Abused: No    Sexually Abused: No  Depression (PHQ2-9): Low Risk (05/26/2023)   Depression (PHQ2-9)    PHQ-2 Score: 0  Alcohol Screen: Not on file  Housing: Unknown  (05/16/2024)   Epic    Unable to Pay for Housing in the Last Year: No    Number of Times Moved in the Last Year: Not on file    Homeless in the Last Year: No  Utilities: Not At Risk (05/16/2024)   Epic    Threatened with loss of utilities: No  Health Literacy: Not on file    Family History  Problem Relation Age of Onset   Breast cancer Mother 31    Allergies[1]  Show/hide medication list[2]  Review of Systems  Constitutional: Negative.  Negative for chills, fever and malaise/fatigue.  HENT: Negative.  Negative for congestion and sore throat.   Eyes: Negative.  Negative for blurred vision and pain.  Respiratory: Negative.  Negative for cough and shortness of breath.   Cardiovascular: Negative.  Negative for chest pain, palpitations and leg swelling.  Gastrointestinal: Negative.  Negative for abdominal pain, blood in stool, constipation, diarrhea, heartburn, melena, nausea and vomiting.  Genitourinary: Negative.  Negative for dysuria, flank pain, frequency and urgency.  Musculoskeletal: Negative.  Negative for joint pain and myalgias.  Skin: Negative.   Neurological: Negative.  Negative for dizziness, tingling, sensory change, weakness and headaches.  Endo/Heme/Allergies: Negative.   Psychiatric/Behavioral: Negative.  Negative for depression and suicidal ideas. The patient is not nervous/anxious.        Objective:   BP (!) 144/86   Pulse (!) 105   Ht 5' 2 (1.575 m)   Wt 293 lb (132.9 kg)   SpO2 96%   BMI 53.59 kg/m   Vitals:   05/24/24 1041  BP: (!) 144/86  Pulse: (!) 105  Height: 5' 2 (1.575 m)  Weight: 293 lb (132.9 kg)  SpO2: 96%  BMI (Calculated): 53.58    Physical Exam Vitals and nursing note reviewed.  Constitutional:      Appearance: Normal appearance.  HENT:     Head: Normocephalic and atraumatic.     Nose: Nose normal.     Mouth/Throat:     Mouth: Mucous membranes are moist.     Pharynx: Oropharynx is clear.  Eyes:     Conjunctiva/sclera:  Conjunctivae normal.     Pupils: Pupils are equal, round, and reactive to light.  Cardiovascular:     Rate and Rhythm: Normal rate and regular rhythm.     Pulses: Normal pulses.     Heart sounds: Normal heart sounds. No murmur heard. Pulmonary:     Effort: Pulmonary effort is normal.     Breath sounds: Normal breath sounds. No wheezing.  Chest:  Breasts:    Right: Normal. No swelling, bleeding, inverted nipple, mass, nipple discharge, skin change or tenderness.  Left: Skin change (cellulitis) present. No swelling, bleeding, inverted nipple, mass, nipple discharge or tenderness.    Abdominal:     General: Bowel sounds are normal.     Palpations: Abdomen is soft.     Tenderness: There is no abdominal tenderness. There is no right CVA tenderness or left CVA tenderness.  Musculoskeletal:        General: Normal range of motion.     Cervical back: Normal range of motion.     Right lower leg: No edema.     Left lower leg: No edema.  Lymphadenopathy:     Upper Body:     Right upper body: No supraclavicular, axillary or pectoral adenopathy.     Left upper body: No supraclavicular, axillary or pectoral adenopathy.  Skin:    General: Skin is warm and dry.  Neurological:     General: No focal deficit present.     Mental Status: She is alert and oriented to person, place, and time.  Psychiatric:        Mood and Affect: Mood normal.        Behavior: Behavior normal.      No results found for any visits on 05/24/24.  Recent Results (from the past 2160 hours)  Resp panel by RT-PCR (RSV, Flu A&B, Covid) Anterior Nasal Swab     Status: None   Collection Time: 05/12/24  3:33 PM   Specimen: Anterior Nasal Swab  Result Value Ref Range   SARS Coronavirus 2 by RT PCR NEGATIVE NEGATIVE    Comment: (NOTE) SARS-CoV-2 target nucleic acids are NOT DETECTED.  The SARS-CoV-2 RNA is generally detectable in upper respiratory specimens during the acute phase of infection. The  lowest concentration of SARS-CoV-2 viral copies this assay can detect is 138 copies/mL. A negative result does not preclude SARS-Cov-2 infection and should not be used as the sole basis for treatment or other patient management decisions. A negative result may occur with  improper specimen collection/handling, submission of specimen other than nasopharyngeal swab, presence of viral mutation(s) within the areas targeted by this assay, and inadequate number of viral copies(<138 copies/mL). A negative result must be combined with clinical observations, patient history, and epidemiological information. The expected result is Negative.  Fact Sheet for Patients:  bloggercourse.com  Fact Sheet for Healthcare Providers:  seriousbroker.it  This test is no t yet approved or cleared by the [place]  FDA and  has been authorized for detection and/or diagnosis of SARS-CoV-2 by FDA under an Emergency Use Authorization (EUA). This EUA will remain  in effect (meaning this test can be used) for the duration of the COVID-19 declaration under Section 564(b)(1) of the Act, 21 U.S.C.section 360bbb-3(b)(1), unless the authorization is terminated  or revoked sooner.       Influenza A by PCR NEGATIVE NEGATIVE   Influenza B by PCR NEGATIVE NEGATIVE    Comment: (NOTE) The Xpert Xpress SARS-CoV-2/FLU/RSV plus assay is intended as an aid in the diagnosis of influenza from Nasopharyngeal swab specimens and should not be used as a sole basis for treatment. Nasal washings and aspirates are unacceptable for Xpert Xpress SARS-CoV-2/FLU/RSV testing.  Fact Sheet for Patients: bloggercourse.com  Fact Sheet for Healthcare Providers: seriousbroker.it  This test is not yet approved or cleared by the [place]  FDA and has been authorized for detection and/or diagnosis of SARS-CoV-2 by FDA under an Emergency  Use Authorization (EUA). This EUA will remain in effect (meaning this test can be used) for the duration of  the COVID-19 declaration under Section 564(b)(1) of the Act, 21 U.S.C. section 360bbb-3(b)(1), unless the authorization is terminated or revoked.     Resp Syncytial Virus by PCR NEGATIVE NEGATIVE    Comment: (NOTE) Fact Sheet for Patients: bloggercourse.com  Fact Sheet for Healthcare Providers: seriousbroker.it  This test is not yet approved or cleared by the [place]  FDA and has been authorized for detection and/or diagnosis of SARS-CoV-2 by FDA under an Emergency Use Authorization (EUA). This EUA will remain in effect (meaning this test can be used) for the duration of the COVID-19 declaration under Section 564(b)(1) of the Act, 21 U.S.C. section 360bbb-3(b)(1), unless the authorization is terminated or revoked.  Performed at [facility], [address] [place], [place] [address]   Pro Brain natriuretic peptide     Status: None   Collection Time: 05/12/24  3:36 PM  Result Value Ref Range   Pro Brain Natriuretic Peptide <50.0 <300.0 pg/mL    Comment: (NOTE) Age Group        Cut-Points    Interpretation  < 50 years     450 pg/mL       NT-proBNP > 450 pg/mL indicates                                ADHF is likely              50 to 75 years  900 pg/mL      NT-proBNP > 900 pg/mL indicates          ADHF is likely  > 75 years      1800 pg/mL     NT-proBNP > 1800 pg/mL indicates          ADHF is likely                           All ages    Results between       Indeterminate. Further clinical             300 and the cut-   information is needed to determine            point for age group   if ADHF is present.                                                             Elecsys proBNP II/ Elecsys proBNP II STAT           Cut-Point                       Interpretation  300 pg/mL                     NT-proBNP <300pg/mL indicates                             ADHF is not likely  Performed at [facility], [address] [place], [place] [address]   Comprehensive metabolic panel     Status: Abnormal   Collection Time: 05/12/24  3:36 PM  Result Value Ref Range   Sodium 138 135 - 145  mmol/L   Potassium 3.5 3.5 - 5.1 mmol/L   Chloride 97 (L) 98 - 111 mmol/L   CO2 25 22 - 32 mmol/L   Glucose, Bld 130 (H) 70 - 99 mg/dL    Comment: Glucose reference range applies only to samples taken after fasting for at least 8 hours.   BUN 6 6 - 20 mg/dL   Creatinine, Ser 9.23 0.44 - 1.00 mg/dL   Calcium 89.3 (H) 8.9 - 10.3 mg/dL   Total Protein 7.7 6.5 - 8.1 g/dL   Albumin 4.5 3.5 - 5.0 g/dL   AST 36 15 - 41 U/L   ALT 28 0 - 44 U/L   Alkaline Phosphatase 64 38 - 126 U/L   Total Bilirubin 0.4 0.0 - 1.2 mg/dL   GFR, Estimated >39 >39 mL/min    Comment: (NOTE) Calculated using the CKD-EPI Creatinine Equation (2021)    Anion gap 15 5 - 15    Comment: Performed at [facility], [address] [place], [place] [address]  Troponin T, High Sensitivity     Status: None   Collection Time: 05/12/24  3:36 PM  Result Value Ref Range   Troponin T High Sensitivity <15 0 - 19 ng/L    Comment: (NOTE) Biotin concentrations > 1000 ng/mL falsely decrease TnT results.  Serial cardiac troponin measurements are suggested.  Refer to the Links section for chest pain algorithms and additional  guidance. Performed at [facility], [address] [place], [place] [address]   CBC with Differential     Status: Abnormal   Collection Time: 05/12/24  3:36 PM  Result Value Ref Range   WBC 5.6 4.0 - 10.5 K/uL   RBC 4.86 3.87 - 5.11 MIL/uL   Hemoglobin 15.4 (H) 12.0 - 15.0 g/dL   HCT 53.3 (H) 63.9 - 53.9 %   MCV 95.9 80.0 - 100.0 fL   MCH 31.7 26.0 - 34.0 pg   MCHC 33.0 30.0 - 36.0 g/dL   RDW 86.8 88.4 - 84.4 %   Platelets 202 150 - 400 K/uL   nRBC 0.0 0.0 - 0.2 %   Neutrophils  Relative % 58 %   Neutro Abs 3.3 1.7 - 7.7 K/uL   Lymphocytes Relative 29 %   Lymphs Abs 1.6 0.7 - 4.0 K/uL   Monocytes Relative 11 %   Monocytes Absolute 0.6 0.1 - 1.0 K/uL   Eosinophils Relative 1 %   Eosinophils Absolute 0.0 0.0 - 0.5 K/uL   Basophils Relative 1 %   Basophils Absolute 0.1 0.0 - 0.1 K/uL   Immature Granulocytes 0 %   Abs Immature Granulocytes 0.02 0.00 - 0.07 K/uL    Comment: Performed at [facility], [address] [place], [place] [address]  D-dimer, quantitative     Status: Abnormal   Collection Time: 05/12/24  3:36 PM  Result Value Ref Range   D-Dimer, Quant 0.55 (H) 0.00 - 0.50 ug/mL-FEU    Comment: (NOTE) At the manufacturer cut-off value of 0.5 g/mL FEU, this assay has a negative predictive value of 95-100%.This assay is intended for use in conjunction with a clinical pretest probability (PTP) assessment model to exclude pulmonary embolism (PE) and deep venous thrombosis (DVT) in outpatients suspected of PE or DVT. Results should be correlated with clinical presentation. Performed at [facility], [address] [place], [place] [address]   Lactic acid, plasma     Status: Abnormal   Collection Time: 05/12/24  3:36 PM  Result  Value Ref Range   Lactic Acid, Venous 2.7 (HH) 0.5 - 1.9 mmol/L    Comment: Critical Value, Read Back and verified with [facility] PINEDA @1657  05/12/24 MJU Performed at [facility], [address] [place], [place] [address]   Lactic acid, plasma     Status: Abnormal   Collection Time: 05/12/24  5:46 PM  Result Value Ref Range   Lactic Acid, Venous 4.5 (HH) 0.5 - 1.9 mmol/L    Comment: Critical value noted. Value is consistent with previously reported and called value MJU Performed at [facility], [address] [place], [place] [address]   Troponin T, High Sensitivity     Status: None   Collection Time: 05/12/24  8:13 PM  Result Value Ref Range   Troponin T High Sensitivity <15 0 -  19 ng/L    Comment: (NOTE) Biotin concentrations > 1000 ng/mL falsely decrease TnT results.  Serial cardiac troponin measurements are suggested.  Refer to the Links section for chest pain algorithms and additional  guidance. Performed at [facility], [address] [place], [place] [address]   HIV Antibody (routine testing w rflx)     Status: None   Collection Time: 05/13/24 12:03 AM  Result Value Ref Range   HIV Screen 4th Generation wRfx Non Reactive Non Reactive    Comment: Performed at [facility], [address] [place], [place] [address]  CBC     Status: None   Collection Time: 05/13/24 12:03 AM  Result Value Ref Range   WBC 6.3 4.0 - 10.5 K/uL   RBC 4.57 3.87 - 5.11 MIL/uL   Hemoglobin 14.5 12.0 - 15.0 g/dL   HCT 56.1 63.9 - 53.9 %   MCV 95.8 80.0 - 100.0 fL   MCH 31.7 26.0 - 34.0 pg   MCHC 33.1 30.0 - 36.0 g/dL   RDW 86.8 88.4 - 84.4 %   Platelets 213 150 - 400 K/uL   nRBC 0.0 0.0 - 0.2 %    Comment: Performed at [facility], [address] [place], [place] [address]  Creatinine, serum     Status: None   Collection Time: 05/13/24 12:03 AM  Result Value Ref Range   Creatinine, Ser 0.79 0.44 - 1.00 mg/dL   GFR, Estimated >39 >39 mL/min    Comment: (NOTE) Calculated using the CKD-EPI Creatinine Equation (2021) Performed at [facility], [address], [place], [place] [address]   Lactic acid, plasma     Status: None   Collection Time: 05/13/24  2:09 AM  Result Value Ref Range   Lactic Acid, Venous 1.5 0.5 - 1.9 mmol/L    Comment: Performed at [facility], [address] [place], [place] [address]  Comprehensive metabolic panel     Status: Abnormal   Collection Time: 05/13/24  4:00 AM  Result Value Ref Range   Sodium 138 135 - 145 mmol/L   Potassium 4.4 3.5 - 5.1 mmol/L   Chloride 102 98 - 111 mmol/L   CO2 27 22 - 32 mmol/L   Glucose, Bld 135 (H) 70 - 99 mg/dL    Comment: Glucose reference range applies  only to samples taken after fasting for at least 8 hours.   BUN 8 6 - 20 mg/dL   Creatinine, Ser 9.26 0.44 - 1.00 mg/dL   Calcium 89.5 (H) 8.9 - 10.3 mg/dL   Total Protein 7.0 6.5 - 8.1 g/dL   Albumin 4.2 3.5 - 5.0 g/dL  AST 42 (H) 15 - 41 U/L   ALT 25 0 - 44 U/L   Alkaline Phosphatase 56 38 - 126 U/L   Total Bilirubin 0.3 0.0 - 1.2 mg/dL   GFR, Estimated >39 >39 mL/min    Comment: (NOTE) Calculated using the CKD-EPI Creatinine Equation (2021)    Anion gap 9 5 - 15    Comment: Performed at [facility], [address] [place], [place] [address]  CBC     Status: None   Collection Time: 05/13/24  4:00 AM  Result Value Ref Range   WBC 7.4 4.0 - 10.5 K/uL   RBC 4.43 3.87 - 5.11 MIL/uL   Hemoglobin 14.1 12.0 - 15.0 g/dL   HCT 57.5 63.9 - 53.9 %   MCV 95.7 80.0 - 100.0 fL   MCH 31.8 26.0 - 34.0 pg   MCHC 33.3 30.0 - 36.0 g/dL   RDW 86.9 88.4 - 84.4 %   Platelets 208 150 - 400 K/uL   nRBC 0.0 0.0 - 0.2 %    Comment: Performed at [facility], [address] [place], [place] [address]  Magnesium     Status: None   Collection Time: 05/13/24  4:00 AM  Result Value Ref Range   Magnesium 2.4 1.7 - 2.4 mg/dL    Comment: Performed at [facility], [address] [place], [place] [address]  Phosphorus     Status: None   Collection Time: 05/13/24  4:00 AM  Result Value Ref Range   Phosphorus 3.1 2.5 - 4.6 mg/dL    Comment: Performed at [facility], [address] [place], [place] [address]  Lactic acid, plasma     Status: None   Collection Time: 05/13/24  4:00 AM  Result Value Ref Range   Lactic Acid, Venous 0.9 0.5 - 1.9 mmol/L    Comment: Performed at [facility], [address] [place], [place] [address]  Respiratory (~20 pathogens) panel by PCR     Status: None   Collection Time: 05/15/24 12:13 PM   Specimen: Nasopharyngeal Swab; Respiratory  Result Value Ref Range   Adenovirus NOT DETECTED NOT DETECTED   Coronavirus 229E  NOT DETECTED NOT DETECTED    Comment: (NOTE) The Coronavirus on the Respiratory Panel, DOES NOT test for the novel  Coronavirus (2019 nCoV)    Coronavirus HKU1 NOT DETECTED NOT DETECTED   Coronavirus NL63 NOT DETECTED NOT DETECTED   Coronavirus OC43 NOT DETECTED NOT DETECTED   Metapneumovirus NOT DETECTED NOT DETECTED   Rhinovirus / Enterovirus NOT DETECTED NOT DETECTED   Influenza A NOT DETECTED NOT DETECTED   Influenza B NOT DETECTED NOT DETECTED   Parainfluenza Virus 1 NOT DETECTED NOT DETECTED   Parainfluenza Virus 2 NOT DETECTED NOT DETECTED   Parainfluenza Virus 3 NOT DETECTED NOT DETECTED   Parainfluenza Virus 4 NOT DETECTED NOT DETECTED   Respiratory Syncytial Virus NOT DETECTED NOT DETECTED   Bordetella pertussis NOT DETECTED NOT DETECTED   Bordetella Parapertussis NOT DETECTED NOT DETECTED   Chlamydophila pneumoniae NOT DETECTED NOT DETECTED   Mycoplasma pneumoniae NOT DETECTED NOT DETECTED    Comment: Performed at [facility], [address], [place], [place] [address]      Assessment & Plan:  Check labs today.  Order Cologuard. Smoking cessation advised again. Warm compresses and Augmentin  for the left breast cellulitis and abscess.  Patient advised to return sooner if it gets worse, may need surgical referral for I&D. Monitor BP.  Problem List Items Addressed This Visit       Cardiovascular and Mediastinum   Essential hypertension, benign   Relevant Orders   CMP14+EGFR     Other   HLD (hyperlipidemia)   Relevant Orders   Lipid Panel w/o Chol/HDL Ratio   Tobacco use disorder, continuous   Obesity, morbid, BMI 50 or higher (HCC)   Vitamin D  deficiency   Relevant Orders   Vitamin D  (25 hydroxy)   Other Visit Diagnoses       Moderate persistent asthma without complication    -  Primary     Cellulitis of left breast       Relevant Medications   amoxicillin-clavulanate (AUGMENTIN) 875-125 MG tablet   Other Relevant Orders   CBC with Diff      Elevated glucose       Relevant Orders   Hemoglobin A1c     Colon cancer screening       Relevant Orders   Cologuard       Return in about 2 weeks (around 06/07/2024).   Total time spent: 30 minutes. This time includes review of previous notes and results and patient face to face interaction during today's visit.    FERNAND FREDY RAMAN, MD  05/24/2024   This document may have been prepared by [facility] Voice Recognition software and as such may include unintentional dictation errors.      [1]  Allergies Allergen Reactions   Codeine    Doxycycline Itching and Swelling    Tongue turned   Lisinopril   [2]  Outpatient Medications Prior to Visit  Medication Sig   albuterol  (VENTOLIN  HFA) 108 (90 Base) MCG/ACT inhaler INHALE 1 TO 2 PUFFS BY MOUTH EVERY 4 TO 6 HOURS AS NEEDED FOR SHORTNESS OF BREATH   amLODipine  (NORVASC ) 5 MG tablet TAKE 1 TABLET BY MOUTH EVERY DAY   benazepril  (LOTENSIN ) 10 MG tablet TAKE 1 TABLET(10 MG) BY MOUTH DAILY   budesonide -formoterol  (SYMBICORT ) 160-4.5 MCG/ACT inhaler Inhale 2 puffs into the lungs in the morning and at bedtime.   famotidine  (PEPCID ) 20 MG tablet Take 20 mg by mouth at bedtime as needed for heartburn or indigestion.   furosemide  (LASIX ) 20 MG tablet Take 1 tablet (20 mg total) by mouth daily.   ibuprofen (ADVIL) 200 MG tablet Take 400 mg by mouth daily as needed for mild pain (pain score 1-3) or moderate pain (pain score 4-6).   ipratropium-albuterol  (DUONEB) 0.5-2.5 (3) MG/3ML SOLN Take 3 mLs by nebulization every 6 (six) hours as needed.   OXYGEN Place 2 L/min into the nose continuous.   predniSONE  (DELTASONE ) 20 MG tablet Take 2 tablets (40 mg total) by mouth daily with breakfast for 1 day, THEN 1.5 tablets (30 mg total) daily with breakfast for 3 days, THEN 1 tablet (20 mg total) daily with breakfast for 3 days, THEN 0.5 tablets (10 mg total) daily with breakfast for 3 days.   simvastatin  (ZOCOR ) 10 MG tablet TAKE 1 TABLET(10 MG) BY MOUTH  DAILY   spironolactone  (ALDACTONE ) 50 MG tablet TAKE 1 TABLET(50 MG) BY MOUTH DAILY   Vitamin D , Ergocalciferol , (DRISDOL) 1.25 MG (50000 UNIT) CAPS capsule TAKE 1 CAPSULE BY MOUTH EVERY WEEK   azithromycin  (ZITHROMAX ) 250 MG tablet Take 1 tablet (250 mg total) by mouth daily. (Patient not taking: Reported on 05/24/2024)   dextromethorphan -guaiFENesin  (MUCINEX  DM) 30-600 MG 12hr tablet Take 1 tablet by mouth 2 (two) times daily. (Patient not taking: Reported on 05/24/2024)   No facility-administered medications prior  to visit.   "

## 2024-05-25 LAB — CMP14+EGFR
ALT: 28 IU/L (ref 0–32)
AST: 17 IU/L (ref 0–40)
Albumin: 4.5 g/dL (ref 3.8–4.9)
Alkaline Phosphatase: 68 IU/L (ref 49–135)
BUN/Creatinine Ratio: 14 (ref 9–23)
BUN: 13 mg/dL (ref 6–24)
Bilirubin Total: 0.2 mg/dL (ref 0.0–1.2)
CO2: 25 mmol/L (ref 20–29)
Calcium: 11.6 mg/dL — ABNORMAL HIGH (ref 8.7–10.2)
Chloride: 99 mmol/L (ref 96–106)
Creatinine, Ser: 0.92 mg/dL (ref 0.57–1.00)
Globulin, Total: 2.7 g/dL (ref 1.5–4.5)
Glucose: 113 mg/dL — ABNORMAL HIGH (ref 70–99)
Potassium: 4.1 mmol/L (ref 3.5–5.2)
Sodium: 140 mmol/L (ref 134–144)
Total Protein: 7.2 g/dL (ref 6.0–8.5)
eGFR: 74 mL/min/1.73

## 2024-05-25 LAB — LIPID PANEL W/O CHOL/HDL RATIO
Cholesterol, Total: 161 mg/dL (ref 100–199)
HDL: 61 mg/dL
LDL Chol Calc (NIH): 75 mg/dL (ref 0–99)
Triglycerides: 148 mg/dL (ref 0–149)
VLDL Cholesterol Cal: 25 mg/dL (ref 5–40)

## 2024-05-25 LAB — CBC WITH DIFFERENTIAL/PLATELET
Basophils Absolute: 0.1 x10E3/uL (ref 0.0–0.2)
Basos: 1 %
EOS (ABSOLUTE): 0.3 x10E3/uL (ref 0.0–0.4)
Eos: 2 %
Hematocrit: 47.1 % — ABNORMAL HIGH (ref 34.0–46.6)
Hemoglobin: 15.4 g/dL (ref 11.1–15.9)
Immature Grans (Abs): 0.1 x10E3/uL (ref 0.0–0.1)
Immature Granulocytes: 1 %
Lymphocytes Absolute: 3 x10E3/uL (ref 0.7–3.1)
Lymphs: 24 %
MCH: 32.4 pg (ref 26.6–33.0)
MCHC: 32.7 g/dL (ref 31.5–35.7)
MCV: 99 fL — ABNORMAL HIGH (ref 79–97)
Monocytes Absolute: 0.7 x10E3/uL (ref 0.1–0.9)
Monocytes: 6 %
Neutrophils Absolute: 8.3 x10E3/uL — ABNORMAL HIGH (ref 1.4–7.0)
Neutrophils: 66 %
Platelets: 279 x10E3/uL (ref 150–450)
RBC: 4.75 x10E6/uL (ref 3.77–5.28)
RDW: 12.9 % (ref 11.7–15.4)
WBC: 12.5 x10E3/uL — ABNORMAL HIGH (ref 3.4–10.8)

## 2024-05-25 LAB — HEMOGLOBIN A1C
Est. average glucose Bld gHb Est-mCnc: 128 mg/dL
Hgb A1c MFr Bld: 6.1 % — ABNORMAL HIGH (ref 4.8–5.6)

## 2024-05-25 LAB — VITAMIN D 25 HYDROXY (VIT D DEFICIENCY, FRACTURES): Vit D, 25-Hydroxy: 23.4 ng/mL — ABNORMAL LOW (ref 30.0–100.0)

## 2024-05-27 ENCOUNTER — Ambulatory Visit: Payer: Self-pay | Admitting: Internal Medicine

## 2024-06-02 ENCOUNTER — Other Ambulatory Visit: Payer: Self-pay | Admitting: Internal Medicine

## 2024-06-02 DIAGNOSIS — E559 Vitamin D deficiency, unspecified: Secondary | ICD-10-CM

## 2024-06-03 LAB — COLOGUARD: COLOGUARD: NEGATIVE

## 2024-06-04 ENCOUNTER — Ambulatory Visit: Admitting: Internal Medicine

## 2024-06-04 NOTE — Progress Notes (Signed)
 Left message to return call

## 2024-06-11 ENCOUNTER — Other Ambulatory Visit: Payer: Self-pay | Admitting: Internal Medicine

## 2024-06-11 DIAGNOSIS — I10 Essential (primary) hypertension: Secondary | ICD-10-CM

## 2024-06-13 ENCOUNTER — Other Ambulatory Visit: Payer: Self-pay | Admitting: Internal Medicine

## 2024-06-13 DIAGNOSIS — J4520 Mild intermittent asthma, uncomplicated: Secondary | ICD-10-CM

## 2024-06-24 ENCOUNTER — Other Ambulatory Visit: Payer: Self-pay | Admitting: Internal Medicine

## 2024-06-24 DIAGNOSIS — E782 Mixed hyperlipidemia: Secondary | ICD-10-CM

## 2024-07-03 ENCOUNTER — Ambulatory Visit: Admitting: Internal Medicine

## 2024-07-10 ENCOUNTER — Other Ambulatory Visit: Payer: Self-pay | Admitting: Internal Medicine

## 2024-07-10 MED ORDER — BUDESONIDE-FORMOTEROL FUMARATE 160-4.5 MCG/ACT IN AERO: 2.0000 | INHALATION_SPRAY | Freq: Two times a day (BID) | RESPIRATORY_TRACT | 1 refills | Status: AC

## 2024-09-27 ENCOUNTER — Ambulatory Visit: Admitting: Internal Medicine
